# Patient Record
Sex: Male | Born: 1940 | Race: White | Hispanic: No | Marital: Single
Health system: Southern US, Community
[De-identification: ages and names within clinical notes are randomized; demographics above are authoritative.]

---

## 2003-08-17 ENCOUNTER — Other Ambulatory Visit: Payer: Self-pay

## 2004-02-24 ENCOUNTER — Other Ambulatory Visit: Payer: Self-pay

## 2004-02-25 ENCOUNTER — Observation Stay: Payer: Self-pay | Admitting: Internal Medicine

## 2004-07-14 ENCOUNTER — Ambulatory Visit: Payer: Self-pay | Admitting: Unknown Physician Specialty

## 2005-02-13 ENCOUNTER — Ambulatory Visit: Payer: Self-pay | Admitting: Rheumatology

## 2006-08-23 ENCOUNTER — Other Ambulatory Visit: Payer: Self-pay

## 2006-08-23 ENCOUNTER — Inpatient Hospital Stay: Payer: Self-pay | Admitting: Internal Medicine

## 2007-05-05 ENCOUNTER — Other Ambulatory Visit: Payer: Self-pay

## 2007-05-05 ENCOUNTER — Inpatient Hospital Stay: Payer: Self-pay | Admitting: Internal Medicine

## 2007-11-17 ENCOUNTER — Ambulatory Visit: Payer: Self-pay | Admitting: Internal Medicine

## 2008-03-09 ENCOUNTER — Ambulatory Visit: Payer: Self-pay | Admitting: Unknown Physician Specialty

## 2008-07-12 ENCOUNTER — Ambulatory Visit: Payer: Self-pay | Admitting: Cardiology

## 2008-07-16 ENCOUNTER — Ambulatory Visit: Payer: Self-pay | Admitting: Cardiology

## 2010-06-21 ENCOUNTER — Ambulatory Visit: Payer: Self-pay | Admitting: Internal Medicine

## 2011-06-11 ENCOUNTER — Ambulatory Visit: Payer: Self-pay | Admitting: Internal Medicine

## 2011-07-26 ENCOUNTER — Ambulatory Visit: Payer: Self-pay | Admitting: Unknown Physician Specialty

## 2011-09-06 ENCOUNTER — Ambulatory Visit: Payer: Self-pay | Admitting: Specialist

## 2012-07-31 ENCOUNTER — Ambulatory Visit: Payer: Self-pay

## 2012-11-06 ENCOUNTER — Other Ambulatory Visit: Payer: Self-pay | Admitting: Unknown Physician Specialty

## 2012-11-06 DIAGNOSIS — R1310 Dysphagia, unspecified: Secondary | ICD-10-CM

## 2012-12-05 ENCOUNTER — Ambulatory Visit
Admission: RE | Admit: 2012-12-05 | Discharge: 2012-12-05 | Disposition: A | Discharge status: Home / Self Care | Payer: Medicare Other | Source: Ambulatory Visit | Attending: Unknown Physician Specialty | Admitting: Unknown Physician Specialty

## 2012-12-05 DIAGNOSIS — R1310 Dysphagia, unspecified: Secondary | ICD-10-CM

## 2013-06-11 ENCOUNTER — Inpatient Hospital Stay: Payer: Self-pay | Admitting: Family Medicine

## 2013-06-11 LAB — URINALYSIS, COMPLETE
Bacteria: NONE SEEN
Bilirubin,UR: NEGATIVE
Blood: NEGATIVE
GLUCOSE, UR: NEGATIVE mg/dL (ref 0–75)
Ketone: NEGATIVE
Leukocyte Esterase: NEGATIVE
Nitrite: NEGATIVE
PH: 7 (ref 4.5–8.0)
PROTEIN: NEGATIVE
RBC, UR: NONE SEEN /HPF (ref 0–5)
SPECIFIC GRAVITY: 1.004 (ref 1.003–1.030)
Squamous Epithelial: NONE SEEN
WBC UR: <1 /HPF (ref 0–5)

## 2013-06-11 LAB — CBC WITH DIFFERENTIAL/PLATELET
BASOS ABS: 0.2 10*3/uL — AB (ref 0.0–0.1)
Basophil %: 1.4 %
EOS ABS: 0.1 10*3/uL (ref 0.0–0.7)
EOS PCT: 1.1 %
HCT: 28.9 % — ABNORMAL LOW (ref 40.0–52.0)
HGB: 9.1 g/dL — ABNORMAL LOW (ref 13.0–18.0)
LYMPHS ABS: 2 10*3/uL (ref 1.0–3.6)
Lymphocyte %: 16.1 %
MCH: 25.5 pg — ABNORMAL LOW (ref 26.0–34.0)
MCHC: 31.5 g/dL — ABNORMAL LOW (ref 32.0–36.0)
MCV: 81 fL (ref 80–100)
MONO ABS: 0.9 x10 3/mm (ref 0.2–1.0)
Monocyte %: 7.6 %
NEUTROS PCT: 73.8 %
Neutrophil #: 8.9 10*3/uL — ABNORMAL HIGH (ref 1.4–6.5)
PLATELETS: 279 10*3/uL (ref 150–440)
RBC: 3.57 10*6/uL — AB (ref 4.40–5.90)
RDW: 16.7 % — ABNORMAL HIGH (ref 11.5–14.5)
WBC: 12.1 10*3/uL — ABNORMAL HIGH (ref 3.8–10.6)

## 2013-06-11 LAB — COMPREHENSIVE METABOLIC PANEL
ALK PHOS: 118 U/L — AB
ANION GAP: 6 — AB (ref 7–16)
Albumin: 2.8 g/dL — ABNORMAL LOW (ref 3.4–5.0)
BUN: 19 mg/dL — ABNORMAL HIGH (ref 7–18)
Bilirubin,Total: 0.8 mg/dL (ref 0.2–1.0)
CALCIUM: 8.2 mg/dL — AB (ref 8.5–10.1)
Chloride: 100 mmol/L (ref 98–107)
Co2: 28 mmol/L (ref 21–32)
Creatinine: 1.26 mg/dL (ref 0.60–1.30)
EGFR (African American): >60
GFR CALC NON AF AMER: 56 — AB
Glucose: 114 mg/dL — ABNORMAL HIGH (ref 65–99)
Osmolality: 271 (ref 275–301)
Potassium: 3.5 mmol/L (ref 3.5–5.1)
SGOT(AST): 24 U/L (ref 15–37)
SGPT (ALT): 14 U/L (ref 12–78)
Sodium: 134 mmol/L — ABNORMAL LOW (ref 136–145)
TOTAL PROTEIN: 7.4 g/dL (ref 6.4–8.2)

## 2013-06-11 LAB — CK TOTAL AND CKMB (NOT AT ARMC)
CK, TOTAL: 62 U/L
CK, Total: 55 U/L
CK-MB: 3.3 ng/mL (ref 0.5–3.6)
CK-MB: 2.7 ng/mL (ref 0.5–3.6)

## 2013-06-11 LAB — TROPONIN I: Troponin-I: 0.03 ng/mL

## 2013-06-11 LAB — PROTIME-INR
INR: 1.3
PROTHROMBIN TIME: 15.7 s — AB (ref 11.5–14.7)

## 2013-06-11 LAB — HEMOGLOBIN A1C: Hemoglobin A1C: 5.5 % (ref 4.2–6.3)

## 2013-06-12 LAB — COMPREHENSIVE METABOLIC PANEL
ALBUMIN: 2.6 g/dL — AB (ref 3.4–5.0)
ALT: 12 U/L (ref 12–78)
Alkaline Phosphatase: 106 U/L
Anion Gap: 5 — ABNORMAL LOW (ref 7–16)
BUN: 18 mg/dL (ref 7–18)
Bilirubin,Total: 0.9 mg/dL (ref 0.2–1.0)
CALCIUM: 8.7 mg/dL (ref 8.5–10.1)
CO2: 27 mmol/L (ref 21–32)
CREATININE: 1.16 mg/dL (ref 0.60–1.30)
Chloride: 103 mmol/L (ref 98–107)
GLUCOSE: 102 mg/dL — AB (ref 65–99)
Osmolality: 272 (ref 275–301)
Potassium: 3.8 mmol/L (ref 3.5–5.1)
SGOT(AST): 17 U/L (ref 15–37)
Sodium: 135 mmol/L — ABNORMAL LOW (ref 136–145)
Total Protein: 7 g/dL (ref 6.4–8.2)

## 2013-06-12 LAB — CK TOTAL AND CKMB (NOT AT ARMC)
CK, Total: 62 U/L
CK-MB: 2.3 ng/mL (ref 0.5–3.6)

## 2013-06-12 LAB — CBC WITH DIFFERENTIAL/PLATELET
Basophil #: 0.1 10*3/uL (ref 0.0–0.1)
Basophil %: 0.8 %
EOS ABS: 0.1 10*3/uL (ref 0.0–0.7)
Eosinophil %: 1.2 %
HCT: 27.9 % — ABNORMAL LOW (ref 40.0–52.0)
HGB: 9.1 g/dL — AB (ref 13.0–18.0)
Lymphocyte #: 1.6 10*3/uL (ref 1.0–3.6)
Lymphocyte %: 14.3 %
MCH: 26.4 pg (ref 26.0–34.0)
MCHC: 32.5 g/dL (ref 32.0–36.0)
MCV: 81 fL (ref 80–100)
Monocyte #: 1 x10 3/mm (ref 0.2–1.0)
Monocyte %: 9.3 %
NEUTROS ABS: 8.4 10*3/uL — AB (ref 1.4–6.5)
Neutrophil %: 74.4 %
PLATELETS: 281 10*3/uL (ref 150–440)
RBC: 3.44 10*6/uL — AB (ref 4.40–5.90)
RDW: 16.7 % — ABNORMAL HIGH (ref 11.5–14.5)
WBC: 11.2 10*3/uL — AB (ref 3.8–10.6)

## 2013-06-12 LAB — TROPONIN I: TROPONIN-I: 0.02 ng/mL

## 2013-06-13 LAB — BASIC METABOLIC PANEL
Anion Gap: 6 — ABNORMAL LOW (ref 7–16)
BUN: 16 mg/dL (ref 7–18)
CREATININE: 1.16 mg/dL (ref 0.60–1.30)
Calcium, Total: 9.2 mg/dL (ref 8.5–10.1)
Chloride: 101 mmol/L (ref 98–107)
Co2: 28 mmol/L (ref 21–32)
EGFR (African American): >60
EGFR (Non-African Amer.): >60
GLUCOSE: 104 mg/dL — AB (ref 65–99)
OSMOLALITY: 272 (ref 275–301)
Potassium: 4 mmol/L (ref 3.5–5.1)
Sodium: 135 mmol/L — ABNORMAL LOW (ref 136–145)

## 2013-06-14 LAB — BASIC METABOLIC PANEL
Anion Gap: 5 — ABNORMAL LOW (ref 7–16)
BUN: 17 mg/dL (ref 7–18)
CHLORIDE: 100 mmol/L (ref 98–107)
Calcium, Total: 9 mg/dL (ref 8.5–10.1)
Co2: 28 mmol/L (ref 21–32)
Creatinine: 1.15 mg/dL (ref 0.60–1.30)
EGFR (African American): >60
Glucose: 109 mg/dL — ABNORMAL HIGH (ref 65–99)
OSMOLALITY: 269 (ref 275–301)
POTASSIUM: 3.8 mmol/L (ref 3.5–5.1)
Sodium: 133 mmol/L — ABNORMAL LOW (ref 136–145)

## 2013-09-17 ENCOUNTER — Ambulatory Visit: Payer: Self-pay | Admitting: Internal Medicine

## 2013-09-17 LAB — CBC CANCER CENTER
Bands: 2 %
Eosinophil: 1 %
HCT: 25 % — ABNORMAL LOW (ref 40.0–52.0)
HGB: 8 g/dL — AB (ref 13.0–18.0)
LYMPHS PCT: 11 %
MCH: 25.8 pg — ABNORMAL LOW (ref 26.0–34.0)
MCHC: 32.1 g/dL (ref 32.0–36.0)
MCV: 80 fL (ref 80–100)
Monocytes: 6 %
PLATELETS: 322 x10 3/mm (ref 150–440)
RBC: 3.12 10*6/uL — AB (ref 4.40–5.90)
RDW: 19.1 % — ABNORMAL HIGH (ref 11.5–14.5)
SEGMENTED NEUTROPHILS: 80 %
WBC: 11.2 x10 3/mm — ABNORMAL HIGH (ref 3.8–10.6)

## 2013-09-17 LAB — IRON AND TIBC
IRON SATURATION: 13 %
Iron Bind.Cap.(Total): 304 ug/dL (ref 250–450)
Iron: 41 ug/dL — ABNORMAL LOW (ref 65–175)
UNBOUND IRON-BIND. CAP.: 263 ug/dL

## 2013-09-17 LAB — LACTATE DEHYDROGENASE: LDH: 235 U/L (ref 85–241)

## 2013-09-17 LAB — FOLATE: FOLIC ACID: 18.1 ng/mL (ref 3.1–100.0)

## 2013-09-21 LAB — KAPPA/LAMBDA FREE LIGHT CHAINS (ARMC)

## 2013-09-22 LAB — CBC WITH DIFFERENTIAL/PLATELET
Basophil #: 0.1 10*3/uL (ref 0.0–0.1)
Basophil %: 0.7 %
EOS ABS: 0.1 10*3/uL (ref 0.0–0.7)
EOS PCT: 1.5 %
HCT: 26.6 % — AB (ref 40.0–52.0)
HGB: 8.8 g/dL — ABNORMAL LOW (ref 13.0–18.0)
LYMPHS PCT: 14.4 %
Lymphocyte #: 1.4 10*3/uL (ref 1.0–3.6)
MCH: 26.3 pg (ref 26.0–34.0)
MCHC: 33.1 g/dL (ref 32.0–36.0)
MCV: 80 fL (ref 80–100)
MONOS PCT: 10.8 %
Monocyte #: 1.1 x10 3/mm — ABNORMAL HIGH (ref 0.2–1.0)
NEUTROS PCT: 72.6 %
Neutrophil #: 7.2 10*3/uL — ABNORMAL HIGH (ref 1.4–6.5)
PLATELETS: 327 10*3/uL (ref 150–440)
RBC: 3.34 10*6/uL — AB (ref 4.40–5.90)
RDW: 18.9 % — ABNORMAL HIGH (ref 11.5–14.5)
WBC: 9.9 10*3/uL (ref 3.8–10.6)

## 2013-09-22 LAB — COMPREHENSIVE METABOLIC PANEL
Albumin: 2.7 g/dL — ABNORMAL LOW (ref 3.4–5.0)
Alkaline Phosphatase: 147 U/L — ABNORMAL HIGH
Anion Gap: 10 (ref 7–16)
BUN: 56 mg/dL — AB (ref 7–18)
Bilirubin,Total: 0.7 mg/dL (ref 0.2–1.0)
CALCIUM: 8.9 mg/dL (ref 8.5–10.1)
CO2: 29 mmol/L (ref 21–32)
Chloride: 79 mmol/L — ABNORMAL LOW (ref 98–107)
Creatinine: 1.91 mg/dL — ABNORMAL HIGH (ref 0.60–1.30)
EGFR (Non-African Amer.): 34 — ABNORMAL LOW
GFR CALC AF AMER: 39 — AB
GLUCOSE: 122 mg/dL — AB (ref 65–99)
Osmolality: 255 (ref 275–301)
Potassium: 3.3 mmol/L — ABNORMAL LOW (ref 3.5–5.1)
SGOT(AST): 28 U/L (ref 15–37)
SGPT (ALT): 19 U/L (ref 12–78)
Sodium: 118 mmol/L — CL (ref 136–145)
Total Protein: 7.7 g/dL (ref 6.4–8.2)

## 2013-09-22 LAB — CBC CANCER CENTER
BASOS ABS: 1 %
Bands: 1 %
EOS PCT: 3 %
HCT: 26.1 % — ABNORMAL LOW (ref 40.0–52.0)
HGB: 8.6 g/dL — ABNORMAL LOW (ref 13.0–18.0)
LYMPHS PCT: 8 %
MCH: 26.2 pg (ref 26.0–34.0)
MCHC: 32.8 g/dL (ref 32.0–36.0)
MCV: 80 fL (ref 80–100)
MONOS PCT: 6 %
Platelet: 341 x10 3/mm (ref 150–440)
RBC: 3.28 10*6/uL — AB (ref 4.40–5.90)
RDW: 19 % — ABNORMAL HIGH (ref 11.5–14.5)
Segmented Neutrophils: 78 %
Variant Lymphocyte: 3 %
WBC: 11 x10 3/mm — ABNORMAL HIGH (ref 3.8–10.6)

## 2013-09-22 LAB — CREATININE, SERUM
Creatinine: 1.81 mg/dL — ABNORMAL HIGH (ref 0.60–1.30)
EGFR (Non-African Amer.): 36 — ABNORMAL LOW
GFR CALC AF AMER: 42 — AB

## 2013-09-22 LAB — RETICULOCYTES
ABSOLUTE RETIC COUNT: 0.1103 10*6/uL (ref 0.019–0.186)
Reticulocyte: 3.37 % — ABNORMAL HIGH (ref 0.4–3.1)

## 2013-09-22 LAB — CALCIUM: CALCIUM: 9.3 mg/dL (ref 8.5–10.1)

## 2013-09-23 ENCOUNTER — Inpatient Hospital Stay: Payer: Self-pay | Admitting: Internal Medicine

## 2013-09-23 LAB — CBC WITH DIFFERENTIAL/PLATELET
BASOS ABS: 0.1 10*3/uL (ref 0.0–0.1)
Basophil %: 0.9 %
EOS PCT: 1.6 %
Eosinophil #: 0.2 10*3/uL (ref 0.0–0.7)
HCT: 24.2 % — ABNORMAL LOW (ref 40.0–52.0)
HGB: 7.9 g/dL — ABNORMAL LOW (ref 13.0–18.0)
Lymphocyte #: 1.6 10*3/uL (ref 1.0–3.6)
Lymphocyte %: 14.8 %
MCH: 26.2 pg (ref 26.0–34.0)
MCHC: 32.9 g/dL (ref 32.0–36.0)
MCV: 80 fL (ref 80–100)
MONO ABS: 1.2 x10 3/mm — AB (ref 0.2–1.0)
MONOS PCT: 11.6 %
NEUTROS ABS: 7.5 10*3/uL — AB (ref 1.4–6.5)
Neutrophil %: 71.1 %
PLATELETS: 300 10*3/uL (ref 150–440)
RBC: 3.04 10*6/uL — ABNORMAL LOW (ref 4.40–5.90)
RDW: 19.3 % — AB (ref 11.5–14.5)
WBC: 10.5 10*3/uL (ref 3.8–10.6)

## 2013-09-23 LAB — BASIC METABOLIC PANEL
Anion Gap: 11 (ref 7–16)
BUN: 60 mg/dL — AB (ref 7–18)
Calcium, Total: 8.5 mg/dL (ref 8.5–10.1)
Chloride: 83 mmol/L — ABNORMAL LOW (ref 98–107)
Co2: 27 mmol/L (ref 21–32)
Creatinine: 1.86 mg/dL — ABNORMAL HIGH (ref 0.60–1.30)
EGFR (African American): 41 — ABNORMAL LOW
GFR CALC NON AF AMER: 35 — AB
Glucose: 97 mg/dL (ref 65–99)
OSMOLALITY: 261 (ref 275–301)
POTASSIUM: 3.5 mmol/L (ref 3.5–5.1)
Sodium: 121 mmol/L — ABNORMAL LOW (ref 136–145)

## 2013-09-23 LAB — URINALYSIS, COMPLETE
BLOOD: NEGATIVE
Bacteria: NONE SEEN
Bilirubin,UR: NEGATIVE
Glucose,UR: NEGATIVE mg/dL (ref 0–75)
Ketone: NEGATIVE
Leukocyte Esterase: NEGATIVE
Nitrite: NEGATIVE
PROTEIN: NEGATIVE
Ph: 7 (ref 4.5–8.0)
RBC,UR: NONE SEEN /HPF (ref 0–5)
Specific Gravity: 1.008 (ref 1.003–1.030)
Squamous Epithelial: NONE SEEN
WBC UR: NONE SEEN /HPF (ref 0–5)

## 2013-09-23 LAB — SODIUM
Sodium: 122 mmol/L — ABNORMAL LOW (ref 136–145)
Sodium: 123 mmol/L — ABNORMAL LOW (ref 136–145)

## 2013-09-23 LAB — OCCULT BLOOD X 1 CARD TO LAB, STOOL: Occult Blood, Feces: NEGATIVE

## 2013-09-23 LAB — TSH: Thyroid Stimulating Horm: 3.01 u[IU]/mL

## 2013-09-24 LAB — BASIC METABOLIC PANEL
Anion Gap: 8 (ref 7–16)
BUN: 56 mg/dL — ABNORMAL HIGH (ref 7–18)
CHLORIDE: 88 mmol/L — AB (ref 98–107)
CREATININE: 1.76 mg/dL — AB (ref 0.60–1.30)
Calcium, Total: 8.7 mg/dL (ref 8.5–10.1)
Co2: 28 mmol/L (ref 21–32)
GFR CALC AF AMER: 43 — AB
GFR CALC NON AF AMER: 38 — AB
GLUCOSE: 116 mg/dL — AB (ref 65–99)
Osmolality: 266 (ref 275–301)
POTASSIUM: 3.7 mmol/L (ref 3.5–5.1)
Sodium: 124 mmol/L — ABNORMAL LOW (ref 136–145)

## 2013-09-24 LAB — CBC WITH DIFFERENTIAL/PLATELET
Basophil #: 0.1 10*3/uL (ref 0.0–0.1)
Basophil %: 0.8 %
Eosinophil #: 0.1 10*3/uL (ref 0.0–0.7)
Eosinophil %: 1.4 %
HCT: 23.8 % — AB (ref 40.0–52.0)
HGB: 7.9 g/dL — ABNORMAL LOW (ref 13.0–18.0)
LYMPHS PCT: 12.9 %
Lymphocyte #: 1.1 10*3/uL (ref 1.0–3.6)
MCH: 27.4 pg (ref 26.0–34.0)
MCHC: 33.2 g/dL (ref 32.0–36.0)
MCV: 83 fL (ref 80–100)
Monocyte #: 1.1 x10 3/mm — ABNORMAL HIGH (ref 0.2–1.0)
Monocyte %: 12.4 %
NEUTROS PCT: 72.5 %
Neutrophil #: 6.4 10*3/uL (ref 1.4–6.5)
PLATELETS: 241 10*3/uL (ref 150–440)
RBC: 2.89 10*6/uL — ABNORMAL LOW (ref 4.40–5.90)
RDW: 19.8 % — ABNORMAL HIGH (ref 11.5–14.5)
WBC: 8.9 10*3/uL (ref 3.8–10.6)

## 2013-09-24 LAB — SODIUM: SODIUM: 125 mmol/L — AB (ref 136–145)

## 2013-09-24 LAB — PROTEIN / CREATININE RATIO, URINE
CREATININE, URINE: 44.5 mg/dL (ref 30.0–125.0)
PROTEIN, RANDOM URINE: 8 mg/dL (ref 0–12)
Protein/Creat. Ratio: 180 mg/gCREAT (ref 0–200)

## 2013-09-24 LAB — PROTIME-INR
INR: 1.3
PROTHROMBIN TIME: 15.8 s — AB (ref 11.5–14.7)

## 2013-09-24 LAB — PROTEIN ELECTROPHORESIS(ARMC)

## 2013-09-25 LAB — CBC WITH DIFFERENTIAL/PLATELET
Basophil #: 0.1 10*3/uL (ref 0.0–0.1)
Basophil %: 1 %
EOS PCT: 1.9 %
Eosinophil #: 0.2 10*3/uL (ref 0.0–0.7)
HCT: 23.5 % — ABNORMAL LOW (ref 40.0–52.0)
HGB: 7.8 g/dL — AB (ref 13.0–18.0)
LYMPHS ABS: 1.3 10*3/uL (ref 1.0–3.6)
Lymphocyte %: 14.3 %
MCH: 27.3 pg (ref 26.0–34.0)
MCHC: 33.3 g/dL (ref 32.0–36.0)
MCV: 82 fL (ref 80–100)
MONOS PCT: 13.3 %
Monocyte #: 1.2 x10 3/mm — ABNORMAL HIGH (ref 0.2–1.0)
Neutrophil #: 6.2 10*3/uL (ref 1.4–6.5)
Neutrophil %: 69.5 %
PLATELETS: 252 10*3/uL (ref 150–440)
RBC: 2.86 10*6/uL — AB (ref 4.40–5.90)
RDW: 20 % — ABNORMAL HIGH (ref 11.5–14.5)
WBC: 9 10*3/uL (ref 3.8–10.6)

## 2013-09-25 LAB — BASIC METABOLIC PANEL
Anion Gap: 7 (ref 7–16)
BUN: 50 mg/dL — ABNORMAL HIGH (ref 7–18)
CALCIUM: 8.4 mg/dL — AB (ref 8.5–10.1)
CO2: 26 mmol/L (ref 21–32)
Chloride: 93 mmol/L — ABNORMAL LOW (ref 98–107)
Creatinine: 1.46 mg/dL — ABNORMAL HIGH (ref 0.60–1.30)
EGFR (Non-African Amer.): 47 — ABNORMAL LOW
GFR CALC AF AMER: 55 — AB
GLUCOSE: 97 mg/dL (ref 65–99)
OSMOLALITY: 267 (ref 275–301)
Potassium: 4.4 mmol/L (ref 3.5–5.1)
Sodium: 126 mmol/L — ABNORMAL LOW (ref 136–145)

## 2013-09-25 LAB — AFP TUMOR MARKER: AFP TUMOR MARKER: 0.9 ng/mL (ref 0.0–8.3)

## 2013-09-26 LAB — BASIC METABOLIC PANEL
Anion Gap: 5 — ABNORMAL LOW (ref 7–16)
BUN: 47 mg/dL — AB (ref 7–18)
CALCIUM: 8.4 mg/dL — AB (ref 8.5–10.1)
CO2: 27 mmol/L (ref 21–32)
CREATININE: 1.4 mg/dL — AB (ref 0.60–1.30)
Chloride: 96 mmol/L — ABNORMAL LOW (ref 98–107)
EGFR (Non-African Amer.): 49 — ABNORMAL LOW
GFR CALC AF AMER: 57 — AB
Glucose: 108 mg/dL — ABNORMAL HIGH (ref 65–99)
Osmolality: 270 (ref 275–301)
Potassium: 4.3 mmol/L (ref 3.5–5.1)
Sodium: 128 mmol/L — ABNORMAL LOW (ref 136–145)

## 2013-09-26 LAB — AMMONIA: Ammonia, Plasma: 39 mcmol/L — ABNORMAL HIGH (ref 11–32)

## 2013-09-26 LAB — HEMOGLOBIN: HGB: 7.4 g/dL — AB (ref 13.0–18.0)

## 2013-09-27 LAB — BASIC METABOLIC PANEL
Anion Gap: 9 (ref 7–16)
BUN: 42 mg/dL — AB (ref 7–18)
CHLORIDE: 98 mmol/L (ref 98–107)
Calcium, Total: 8.1 mg/dL — ABNORMAL LOW (ref 8.5–10.1)
Co2: 23 mmol/L (ref 21–32)
Creatinine: 1.58 mg/dL — ABNORMAL HIGH (ref 0.60–1.30)
EGFR (African American): 50 — ABNORMAL LOW
EGFR (Non-African Amer.): 43 — ABNORMAL LOW
GLUCOSE: 91 mg/dL (ref 65–99)
Osmolality: 271 (ref 275–301)
Potassium: 4.8 mmol/L (ref 3.5–5.1)
SODIUM: 130 mmol/L — AB (ref 136–145)

## 2013-09-28 LAB — BASIC METABOLIC PANEL
Anion Gap: 9 (ref 7–16)
BUN: 41 mg/dL — ABNORMAL HIGH (ref 7–18)
CHLORIDE: 98 mmol/L (ref 98–107)
CREATININE: 1.28 mg/dL (ref 0.60–1.30)
Calcium, Total: 8.6 mg/dL (ref 8.5–10.1)
Co2: 25 mmol/L (ref 21–32)
EGFR (African American): >60
GFR CALC NON AF AMER: 55 — AB
Glucose: 86 mg/dL (ref 65–99)
OSMOLALITY: 274 (ref 275–301)
POTASSIUM: 4.4 mmol/L (ref 3.5–5.1)
Sodium: 132 mmol/L — ABNORMAL LOW (ref 136–145)

## 2013-09-29 LAB — BASIC METABOLIC PANEL
Anion Gap: 7 (ref 7–16)
BUN: 41 mg/dL — AB (ref 7–18)
CALCIUM: 8.7 mg/dL (ref 8.5–10.1)
CO2: 25 mmol/L (ref 21–32)
Chloride: 99 mmol/L (ref 98–107)
Creatinine: 1.6 mg/dL — ABNORMAL HIGH (ref 0.60–1.30)
EGFR (Non-African Amer.): 42 — ABNORMAL LOW
GFR CALC AF AMER: 49 — AB
Glucose: 94 mg/dL (ref 65–99)
Osmolality: 273 (ref 275–301)
POTASSIUM: 4.6 mmol/L (ref 3.5–5.1)
Sodium: 131 mmol/L — ABNORMAL LOW (ref 136–145)

## 2013-09-29 LAB — CBC WITH DIFFERENTIAL/PLATELET
Basophil #: 0.1 10*3/uL (ref 0.0–0.1)
Basophil %: 0.7 %
EOS PCT: 1.9 %
Eosinophil #: 0.2 10*3/uL (ref 0.0–0.7)
HCT: 22.3 % — AB (ref 40.0–52.0)
HGB: 7 g/dL — AB (ref 13.0–18.0)
LYMPHS PCT: 11.7 %
Lymphocyte #: 1.4 10*3/uL (ref 1.0–3.6)
MCH: 26.2 pg (ref 26.0–34.0)
MCHC: 31.4 g/dL — ABNORMAL LOW (ref 32.0–36.0)
MCV: 84 fL (ref 80–100)
MONO ABS: 1.1 x10 3/mm — AB (ref 0.2–1.0)
Monocyte %: 9.9 %
Neutrophil #: 8.7 10*3/uL — ABNORMAL HIGH (ref 1.4–6.5)
Neutrophil %: 75.8 %
Platelet: 224 10*3/uL (ref 150–440)
RBC: 2.67 10*6/uL — ABNORMAL LOW (ref 4.40–5.90)
RDW: 20.3 % — ABNORMAL HIGH (ref 11.5–14.5)
WBC: 11.5 10*3/uL — AB (ref 3.8–10.6)

## 2013-09-29 LAB — UR PROT ELECTROPHORESIS, URINE RANDOM

## 2013-09-30 LAB — CBC WITH DIFFERENTIAL/PLATELET
BASOS ABS: 0.1 10*3/uL (ref 0.0–0.1)
Basophil %: 0.6 %
Eosinophil #: 0.3 10*3/uL (ref 0.0–0.7)
Eosinophil %: 2.6 %
HCT: 23.8 % — AB (ref 40.0–52.0)
HGB: 7.5 g/dL — ABNORMAL LOW (ref 13.0–18.0)
Lymphocyte #: 1 10*3/uL (ref 1.0–3.6)
Lymphocyte %: 9.7 %
MCH: 26.5 pg (ref 26.0–34.0)
MCHC: 31.4 g/dL — AB (ref 32.0–36.0)
MCV: 84 fL (ref 80–100)
MONO ABS: 1.1 x10 3/mm — AB (ref 0.2–1.0)
MONOS PCT: 10.6 %
NEUTROS PCT: 76.5 %
Neutrophil #: 8.2 10*3/uL — ABNORMAL HIGH (ref 1.4–6.5)
PLATELETS: 227 10*3/uL (ref 150–440)
RBC: 2.82 10*6/uL — ABNORMAL LOW (ref 4.40–5.90)
RDW: 21 % — ABNORMAL HIGH (ref 11.5–14.5)
WBC: 10.7 10*3/uL — ABNORMAL HIGH (ref 3.8–10.6)

## 2013-09-30 LAB — BASIC METABOLIC PANEL
ANION GAP: 8 (ref 7–16)
BUN: 43 mg/dL — ABNORMAL HIGH (ref 7–18)
Calcium, Total: 8.3 mg/dL — ABNORMAL LOW (ref 8.5–10.1)
Chloride: 96 mmol/L — ABNORMAL LOW (ref 98–107)
Co2: 23 mmol/L (ref 21–32)
Creatinine: 2.28 mg/dL — ABNORMAL HIGH (ref 0.60–1.30)
GFR CALC AF AMER: 32 — AB
GFR CALC NON AF AMER: 27 — AB
GLUCOSE: 92 mg/dL (ref 65–99)
Osmolality: 266 (ref 275–301)
POTASSIUM: 4.7 mmol/L (ref 3.5–5.1)
Sodium: 127 mmol/L — ABNORMAL LOW (ref 136–145)

## 2013-10-01 LAB — CBC WITH DIFFERENTIAL/PLATELET
Basophil #: 0.1 10*3/uL (ref 0.0–0.1)
Basophil %: 0.9 %
EOS ABS: 0.2 10*3/uL (ref 0.0–0.7)
Eosinophil %: 3 %
HCT: 23.2 % — ABNORMAL LOW (ref 40.0–52.0)
HGB: 7.4 g/dL — ABNORMAL LOW (ref 13.0–18.0)
Lymphocyte #: 1.5 10*3/uL (ref 1.0–3.6)
Lymphocyte %: 18 %
MCH: 27 pg (ref 26.0–34.0)
MCHC: 32.1 g/dL (ref 32.0–36.0)
MCV: 84 fL (ref 80–100)
MONO ABS: 1 x10 3/mm (ref 0.2–1.0)
Monocyte %: 12.1 %
NEUTROS PCT: 66 %
Neutrophil #: 5.4 10*3/uL (ref 1.4–6.5)
Platelet: 221 10*3/uL (ref 150–440)
RBC: 2.76 10*6/uL — AB (ref 4.40–5.90)
RDW: 20.9 % — AB (ref 11.5–14.5)
WBC: 8.2 10*3/uL (ref 3.8–10.6)

## 2013-10-01 LAB — BASIC METABOLIC PANEL
Anion Gap: 6 — ABNORMAL LOW (ref 7–16)
BUN: 48 mg/dL — AB (ref 7–18)
Calcium, Total: 8.5 mg/dL (ref 8.5–10.1)
Chloride: 98 mmol/L (ref 98–107)
Co2: 26 mmol/L (ref 21–32)
Creatinine: 2.38 mg/dL — ABNORMAL HIGH (ref 0.60–1.30)
EGFR (African American): 30 — ABNORMAL LOW
EGFR (Non-African Amer.): 26 — ABNORMAL LOW
Glucose: 95 mg/dL (ref 65–99)
OSMOLALITY: 273 (ref 275–301)
Potassium: 5.3 mmol/L — ABNORMAL HIGH (ref 3.5–5.1)
SODIUM: 130 mmol/L — AB (ref 136–145)

## 2013-10-17 ENCOUNTER — Ambulatory Visit: Payer: Self-pay | Admitting: Internal Medicine

## 2013-10-25 ENCOUNTER — Emergency Department: Payer: Self-pay | Admitting: Emergency Medicine

## 2013-10-25 LAB — CBC
HCT: 30.7 % — ABNORMAL LOW (ref 40.0–52.0)
HGB: 9.5 g/dL — ABNORMAL LOW (ref 13.0–18.0)
MCH: 25.8 pg — ABNORMAL LOW (ref 26.0–34.0)
MCHC: 30.8 g/dL — ABNORMAL LOW (ref 32.0–36.0)
MCV: 84 fL (ref 80–100)
Platelet: 280 10*3/uL (ref 150–440)
RBC: 3.67 10*6/uL — AB (ref 4.40–5.90)
RDW: 20 % — AB (ref 11.5–14.5)
WBC: 9 10*3/uL (ref 3.8–10.6)

## 2013-10-25 LAB — URINALYSIS, COMPLETE
BLOOD: NEGATIVE
Bacteria: NONE SEEN
Bilirubin,UR: NEGATIVE
Glucose,UR: NEGATIVE mg/dL (ref 0–75)
Ketone: NEGATIVE
Leukocyte Esterase: NEGATIVE
Nitrite: NEGATIVE
PH: 6 (ref 4.5–8.0)
Protein: NEGATIVE
RBC,UR: NONE SEEN /HPF (ref 0–5)
Specific Gravity: 1.009 (ref 1.003–1.030)
Squamous Epithelial: NONE SEEN
WBC UR: <1 /HPF (ref 0–5)

## 2013-10-25 LAB — BASIC METABOLIC PANEL
Anion Gap: 10 (ref 7–16)
BUN: 23 mg/dL — AB (ref 7–18)
CHLORIDE: 99 mmol/L (ref 98–107)
CO2: 27 mmol/L (ref 21–32)
Calcium, Total: 8.5 mg/dL (ref 8.5–10.1)
Creatinine: 1.59 mg/dL — ABNORMAL HIGH (ref 0.60–1.30)
EGFR (African American): 49 — ABNORMAL LOW
EGFR (Non-African Amer.): 42 — ABNORMAL LOW
Glucose: 92 mg/dL (ref 65–99)
Osmolality: 275 (ref 275–301)
Potassium: 4.2 mmol/L (ref 3.5–5.1)
Sodium: 136 mmol/L (ref 136–145)

## 2013-10-25 LAB — CK: CK, Total: 67 U/L

## 2013-10-25 LAB — TROPONIN I: Troponin-I: 0.03 ng/mL

## 2013-11-17 ENCOUNTER — Ambulatory Visit: Payer: Self-pay | Admitting: Hospice and Palliative Medicine

## 2013-11-21 ENCOUNTER — Inpatient Hospital Stay: Payer: Self-pay

## 2013-11-21 LAB — CBC WITH DIFFERENTIAL/PLATELET
BASOS ABS: 0.1 10*3/uL (ref 0.0–0.1)
BASOS PCT: 1.3 %
Eosinophil #: 0.1 10*3/uL (ref 0.0–0.7)
Eosinophil %: 1.8 %
HCT: 28.4 % — ABNORMAL LOW (ref 40.0–52.0)
HGB: 9.1 g/dL — ABNORMAL LOW (ref 13.0–18.0)
Lymphocyte #: 1.4 10*3/uL (ref 1.0–3.6)
Lymphocyte %: 18.8 %
MCH: 26.3 pg (ref 26.0–34.0)
MCHC: 32.2 g/dL (ref 32.0–36.0)
MCV: 82 fL (ref 80–100)
Monocyte #: 1 x10 3/mm (ref 0.2–1.0)
Monocyte %: 13.6 %
NEUTROS PCT: 64.5 %
Neutrophil #: 4.7 10*3/uL (ref 1.4–6.5)
PLATELETS: 261 10*3/uL (ref 150–440)
RBC: 3.47 10*6/uL — ABNORMAL LOW (ref 4.40–5.90)
RDW: 21 % — AB (ref 11.5–14.5)
WBC: 7.3 10*3/uL (ref 3.8–10.6)

## 2013-11-21 LAB — COMPREHENSIVE METABOLIC PANEL
ALBUMIN: 2.4 g/dL — AB (ref 3.4–5.0)
ALK PHOS: 103 U/L
ALT: 15 U/L
ANION GAP: 7 (ref 7–16)
AST: 34 U/L (ref 15–37)
BUN: 36 mg/dL — AB (ref 7–18)
Bilirubin,Total: 1 mg/dL (ref 0.2–1.0)
CREATININE: 2.39 mg/dL — AB (ref 0.60–1.30)
Calcium, Total: 7.9 mg/dL — ABNORMAL LOW (ref 8.5–10.1)
Chloride: 98 mmol/L (ref 98–107)
Co2: 32 mmol/L (ref 21–32)
GFR CALC AF AMER: 30 — AB
GFR CALC NON AF AMER: 26 — AB
GLUCOSE: 98 mg/dL (ref 65–99)
OSMOLALITY: 282 (ref 275–301)
Potassium: 3.4 mmol/L — ABNORMAL LOW (ref 3.5–5.1)
Sodium: 137 mmol/L (ref 136–145)
TOTAL PROTEIN: 6.6 g/dL (ref 6.4–8.2)

## 2013-11-21 LAB — URINALYSIS, COMPLETE
BILIRUBIN, UR: NEGATIVE
Bacteria: NONE SEEN
Blood: NEGATIVE
GLUCOSE, UR: NEGATIVE mg/dL (ref 0–75)
Hyaline Cast: 6
Ketone: NEGATIVE
Leukocyte Esterase: NEGATIVE
NITRITE: NEGATIVE
PROTEIN: NEGATIVE
Ph: 6 (ref 4.5–8.0)
RBC, UR: NONE SEEN /HPF (ref 0–5)
Specific Gravity: 1.008 (ref 1.003–1.030)
Squamous Epithelial: NONE SEEN
WBC UR: 1 /HPF (ref 0–5)

## 2013-11-21 LAB — CK-MB
CK-MB: 4.6 ng/mL — AB (ref 0.5–3.6)
CK-MB: 4.1 ng/mL — ABNORMAL HIGH (ref 0.5–3.6)

## 2013-11-21 LAB — TROPONIN I
Troponin-I: 2.1 ng/mL — ABNORMAL HIGH
Troponin-I: 1.9 ng/mL — ABNORMAL HIGH

## 2013-11-21 LAB — AMMONIA: Ammonia, Plasma: 22 mcmol/L (ref 11–32)

## 2013-11-21 LAB — APTT: ACTIVATED PTT: 31.2 s (ref 23.6–35.9)

## 2013-11-21 LAB — PROTIME-INR
INR: 1.5
Prothrombin Time: 17.9 secs — ABNORMAL HIGH (ref 11.5–14.7)

## 2013-11-22 LAB — BASIC METABOLIC PANEL
Anion Gap: 9 (ref 7–16)
BUN: 37 mg/dL — ABNORMAL HIGH (ref 7–18)
CALCIUM: 7.9 mg/dL — AB (ref 8.5–10.1)
CHLORIDE: 98 mmol/L (ref 98–107)
CO2: 30 mmol/L (ref 21–32)
Creatinine: 2.41 mg/dL — ABNORMAL HIGH (ref 0.60–1.30)
EGFR (African American): 30 — ABNORMAL LOW
EGFR (Non-African Amer.): 26 — ABNORMAL LOW
Glucose: 78 mg/dL (ref 65–99)
Osmolality: 281 (ref 275–301)
Potassium: 3.3 mmol/L — ABNORMAL LOW (ref 3.5–5.1)
SODIUM: 137 mmol/L (ref 136–145)

## 2013-11-22 LAB — TROPONIN I: Troponin-I: 1.6 ng/mL — ABNORMAL HIGH

## 2013-11-22 LAB — CBC WITH DIFFERENTIAL/PLATELET
Basophil #: 0.1 10*3/uL (ref 0.0–0.1)
Basophil %: 1.8 %
Eosinophil #: 0.2 10*3/uL (ref 0.0–0.7)
Eosinophil %: 3.3 %
HCT: 29.3 % — ABNORMAL LOW (ref 40.0–52.0)
HGB: 9.5 g/dL — ABNORMAL LOW (ref 13.0–18.0)
Lymphocyte #: 1.7 10*3/uL (ref 1.0–3.6)
Lymphocyte %: 22.9 %
MCH: 26.3 pg (ref 26.0–34.0)
MCHC: 32.2 g/dL (ref 32.0–36.0)
MCV: 82 fL (ref 80–100)
Monocyte #: 1.1 x10 3/mm — ABNORMAL HIGH (ref 0.2–1.0)
Monocyte %: 14.7 %
Neutrophil #: 4.3 10*3/uL (ref 1.4–6.5)
Neutrophil %: 57.3 %
Platelet: 266 10*3/uL (ref 150–440)
RBC: 3.6 10*6/uL — ABNORMAL LOW (ref 4.40–5.90)
RDW: 20.8 % — AB (ref 11.5–14.5)
WBC: 7.4 10*3/uL (ref 3.8–10.6)

## 2013-11-22 LAB — TSH: Thyroid Stimulating Horm: 7.47 u[IU]/mL — ABNORMAL HIGH

## 2013-11-22 LAB — CK-MB: CK-MB: 3.6 ng/mL (ref 0.5–3.6)

## 2013-11-22 LAB — AMMONIA: Ammonia, Plasma: 44 mcmol/L — ABNORMAL HIGH (ref 11–32)

## 2013-11-23 LAB — AMMONIA: Ammonia, Plasma: 21 mcmol/L (ref 11–32)

## 2013-11-24 LAB — BASIC METABOLIC PANEL
ANION GAP: 5 — AB (ref 7–16)
BUN: 29 mg/dL — ABNORMAL HIGH (ref 7–18)
Calcium, Total: 8.4 mg/dL — ABNORMAL LOW (ref 8.5–10.1)
Chloride: 97 mmol/L — ABNORMAL LOW (ref 98–107)
Co2: 34 mmol/L — ABNORMAL HIGH (ref 21–32)
Creatinine: 1.35 mg/dL — ABNORMAL HIGH (ref 0.60–1.30)
GFR CALC AF AMER: 60 — AB
GFR CALC NON AF AMER: 52 — AB
GLUCOSE: 103 mg/dL — AB (ref 65–99)
OSMOLALITY: 278 (ref 275–301)
Potassium: 3.2 mmol/L — ABNORMAL LOW (ref 3.5–5.1)
Sodium: 136 mmol/L (ref 136–145)

## 2013-11-25 LAB — BASIC METABOLIC PANEL
ANION GAP: 5 — AB (ref 7–16)
BUN: 24 mg/dL — ABNORMAL HIGH (ref 7–18)
Calcium, Total: 8.8 mg/dL (ref 8.5–10.1)
Chloride: 98 mmol/L (ref 98–107)
Co2: 33 mmol/L — ABNORMAL HIGH (ref 21–32)
Creatinine: 1.31 mg/dL — ABNORMAL HIGH (ref 0.60–1.30)
EGFR (African American): >60
EGFR (Non-African Amer.): 54 — ABNORMAL LOW
Glucose: 89 mg/dL (ref 65–99)
Osmolality: 275 (ref 275–301)
Potassium: 3.4 mmol/L — ABNORMAL LOW (ref 3.5–5.1)
Sodium: 136 mmol/L (ref 136–145)

## 2013-11-26 ENCOUNTER — Ambulatory Visit: Payer: Self-pay | Admitting: Internal Medicine

## 2013-12-17 ENCOUNTER — Ambulatory Visit: Payer: Self-pay | Admitting: Hospice and Palliative Medicine

## 2014-01-17 DEATH — deceased

## 2014-07-10 NOTE — Consult Note (Signed)
Chief Complaint:  Subjective/Chief Complaint Continue confusion altered mental status  more alert but still confused. denies any chest pain   VITAL SIGNS/ANCILLARY NOTES: **Vital Signs.:   07-Sep-15 09:47  Vital Signs Type Q 4hr  Temperature Temperature (F) 98.1  Celsius 36.7  Pulse Pulse 64  Respirations Respirations 20  Systolic BP Systolic BP 165  Diastolic BP (mmHg) Diastolic BP (mmHg) 64  Mean BP 89  Pulse Ox % Pulse Ox % 92  Pulse Ox Activity Level  At rest  Oxygen Delivery 3L  Telemetry pattern Cardiac Rhythm pattern reported by Telemetry Clerk; dual paced  *Intake and Output.:   Shift 07-Sep-15 07:00  Grand Totals Intake:  0 Output:  300    Net:  -300 24 Hr.:  -790  IV (Primary)      In:  0  IV (Secondary)      In:  0  Urine ml     Out:  300  Length of Stay Totals Intake:  540 Output:  1525    Net:  -985   Brief Assessment:  GEN well developed, well nourished, no acute distress   Cardiac Irregular  murmur present  + LE edema  -- JVD   Respiratory normal resp effort  clear BS   Gastrointestinal Normal   Gastrointestinal details normal Soft  Nontender  Nondistended  No masses palpable   EXTR positive edema   Lab Results: Thyroid:  06-Sep-15 02:23   Thyroid Stimulating Hormone  7.47 (0.45-4.50 (IU = International Unit)  ----------------------- Pregnant patients have  different reference  ranges for TSH:  - - - - - - - - - -  Pregnant, first trimetser:  0.36 - 2.50 uIU/mL)  Routine Chem:  06-Sep-15 02:23   Ammonia, Plasma  44 (Result(s) reported on 22 Nov 2013 at 03:28AM.)  Result Comment TROPONIN - RESULTS VERIFIED BY REPEAT TESTING.  - ELEVATED TROPONIN PREVIOUSLY CALLED AT  - 1912 11/21/13.PMH  Result(s) reported on 22 Nov 2013 at 03:38AM.  Glucose, Serum 78  BUN  37  Creatinine (comp)  2.41  Sodium, Serum 137  Potassium, Serum  3.3  Chloride, Serum 98  CO2, Serum 30  Calcium (Total), Serum  7.9  Anion Gap 9  Osmolality (calc) 281  eGFR  (African American)  30  eGFR (Non-African American)  26 (eGFR values <79m/min/1.73 m2 may be an indication of chronic kidney disease (CKD). Calculated eGFR is useful in patients with stable renal function. The eGFR calculation will not be reliable in acutely ill patients when serum creatinine is changing rapidly. It is not useful in  patients on dialysis. The eGFR calculation may not be applicable to patients at the low and high extremes of body sizes, pregnant women, and vegetarians.)  07-Sep-15 03:52   Ammonia, Plasma 21 (Result(s) reported on 23 Nov 2013 at 04:32AM.)  Cardiac:  06-Sep-15 02:23   Troponin I  1.60 (0.00-0.05 0.05 ng/mL or less: NEGATIVE  Repeat testing in 3-6 hrs  if clinically indicated. >0.05 ng/mL: POTENTIAL  MYOCARDIAL INJURY. Repeat  testing in 3-6 hrs if  clinically indicated. NOTE: An increase or decrease  of 30% or more on serial  testing suggests a  clinically important change)  CPK-MB, Serum 3.6 (Result(s) reported on 22 Nov 2013 at 03:33AM.)  Routine Hem:  06-Sep-15 02:23   WBC (CBC) 7.4  RBC (CBC)  3.60  Hemoglobin (CBC)  9.5  Hematocrit (CBC)  29.3  Platelet Count (CBC) 266  MCV 82  MCH 26.3  MCHC 32.2  RDW  20.8  Neutrophil % 57.3  Lymphocyte % 22.9  Monocyte % 14.7  Eosinophil % 3.3  Basophil % 1.8  Neutrophil # 4.3  Lymphocyte # 1.7  Monocyte #  1.1  Eosinophil # 0.2  Basophil # 0.1 (Result(s) reported on 22 Nov 2013 at 02:51AM.)   Radiology Results: XRay:    05-Sep-15 19:51, Chest Portable Single View  Chest Portable Single View   REASON FOR EXAM:    ALTERED MENTAL STATUS ABNORMAL TROPONIN  COMMENTS:       PROCEDURE: DXR - DXR PORTABLE CHEST SINGLE VIEW  - Nov 21 2013  7:51PM     CLINICAL DATA:  Altered mental status.    EXAM:  PORTABLE CHEST - 1 VIEW    COMPARISON:  09/22/2013 and 06/13/2013    FINDINGS:  Left-sided pacemaker is unchanged. Sternotomy wires are unchanged  which fracture of the most superior wire. A  lordotic technique is  demonstrated as the lungs are adequately inflated. There is mild  prominence of the perihilar markings suggesting mild vascular  congestion. There is continued minimal blunting of the right  costophrenic angle suggesting a small moderate pleural fluid.  Minimal stable increased interstitial markings over the lateral left  base. Stable mild cardiomegaly. Remainder the exam is unchanged.     IMPRESSION:  Findings suggesting minimal vascular congestion with a small amount  of right pleural fluid unchanged.      Electronically Signed    By: Marin Olp M.D.    On: 11/21/2013 20:50     Verified By: Pearletha Alfred, M.D.,  CT:    05-Sep-15 19:50, CT Head Without Contrast  CT Head Without Contrast   REASON FOR EXAM:    confusion  COMMENTS:       PROCEDURE: CT  - CT HEAD WITHOUT CONTRAST  - Nov 21 2013  7:50PM     CLINICAL DATA:  Confusion, altered mental status.    EXAM:  CT HEAD WITHOUT CONTRAST    TECHNIQUE:  Contiguous axial images were obtained from the base of the skull  through the vertex without intravenous contrast.    COMPARISON:  10/25/2013  FINDINGS:  There is no evidence of acute cortical infarct, intracranial  hemorrhage, mass, midline shift, or extra-axial fluid collection.  Focal hypodensity in the anterior right corona radiata is unchanged  and suggestive of an old lacunar infarct. Patchy hypodensities in  the periventricular white matter elsewhere are similar to the prior  study and nonspecific but compatible with mild to moderate chronic  small vessel ischemic disease. Ventricles and sulci are within  normal limits for age.    Orbits are unremarkable. Left-sided scalp hematoma near the vertex  has nearly completely resolved. There are small, right greater than  leftmastoid effusions, similar to slightly increased from prior.  Minimal left maxillary sinus mucosal thickening is noted. Moderate  atherosclerotic carotid siphon  calcification is noted. No skull  fracture is seen.     IMPRESSION:  1. No acute intracranial abnormality.  2. Mild-to-moderate chronic small vessel ischemic disease.      Electronically Signed    By: Logan Bores    On: 11/21/2013 20:51         Verified By: Ferol Luz, M.D.,   Assessment/Plan:  Assessment/Plan:  Assessment IMP  congestive heart failure  hypertension  altered mental status  obesity  hepatic encephalopathy  coronary disease  COPD .   Plan PLAN  restart lactulose to help with encephalopathy  continue blood pressure support  continue therapy for fluid retention  coronary disease appears to be stable continue secondary prevention  continue heart failure therapy with diuretics  continue Lipitor for hyperlipidemia  physical therapy and to help with mobility and strength training  medical therapy for now do not recommend any further cardiac studies at this point   Electronic Signatures: Lujean Amel D (MD)  (Signed 07-Sep-15 14:00)  Authored: Chief Complaint, VITAL SIGNS/ANCILLARY NOTES, Brief Assessment, Lab Results, Radiology Results, Assessment/Plan   Last Updated: 07-Sep-15 14:00 by Lujean Amel D (MD)

## 2014-07-10 NOTE — Consult Note (Signed)
I have seen and examined Mr Rodney Eaton and agree with Wilhelmenia BlaseKaryn Earle's a/p.  he may have cirrhosis,  based on the u/s and the labs, the cirrhosis is very well compensated.  Doubt his constelation of symptoms related to the cirrhosis.   watch Hgb,   EGD as inpt only if drop in Hgb or melena or hematochezia. low Na dietEGD as outpt to eval for varices. f/u hepatitis serologiesGI clinic f/u.   Electronic Signatures: Dow Adolphein, Jhamal Plucinski (MD)  (Signed on 09-Jul-15 16:39)  Authored  Last Updated: 09-Jul-15 16:39 by Dow Adolphein, Swain Acree (MD)

## 2014-07-10 NOTE — Consult Note (Signed)
Chief Complaint:  Subjective/Chief Complaint Patient denies any problems today. Says he is feeling well. Has cirrhosis and questionable HE. He is beiong treated with lactulose and reports one loose stool last night but none since then.   VITAL SIGNS/ANCILLARY NOTES: **Vital Signs.:   11-Jul-15 04:15  Vital Signs Type Routine  Temperature Temperature (F) 97.9  Celsius 36.6  Temperature Source oral  Pulse Pulse 60  Respirations Respirations 18  Systolic BP Systolic BP 833  Diastolic BP (mmHg) Diastolic BP (mmHg) 59  Mean BP 77  Pulse Ox % Pulse Ox % 90  Pulse Ox Activity Level  At rest  Oxygen Delivery 2L   Brief Assessment:  GEN well developed, well nourished, no acute distress   Respiratory normal resp effort   Gastrointestinal Normal   Gastrointestinal details normal Soft  Nontender   Lab Results: Routine Chem:  11-Jul-15 07:23   Ammonia, Plasma  39 (Result(s) reported on 26 Sep 2013 at 08:18AM.)  Glucose, Serum  108  BUN  47  Creatinine (comp)  1.40  Sodium, Serum  128  Potassium, Serum 4.3  Chloride, Serum  96  CO2, Serum 27  Calcium (Total), Serum  8.4  Anion Gap  5  Osmolality (calc) 270  eGFR (African American)  57  eGFR (Non-African American)  49 (eGFR values <94m/min/1.73 m2 may be an indication of chronic kidney disease (CKD). Calculated eGFR is useful in patients with stable renal function. The eGFR calculation will not be reliable in acutely ill patients when serum creatinine is changing rapidly. It is not useful in  patients on dialysis. The eGFR calculation may not be applicable to patients at the low and high extremes of body sizes, pregnant women, and vegetarians.)  Routine Hem:  11-Jul-15 07:23   Hemoglobin (CBC)  7.4 (Result(s) reported on 26 Sep 2013 at 07:45AM.)   Assessment/Plan:  Assessment/Plan:  Assessment Cirrhosis and questionsable HE.   Plan Patient doing well now.  Will continue the lactulose and adjust if he has diarrhea or  not moving his bowels with 2 formed stools a day.   Electronic Signatures: WLucilla Lame(MD)  (Signed 11-Jul-15 09:50)  Authored: Chief Complaint, VITAL SIGNS/ANCILLARY NOTES, Brief Assessment, Lab Results, Assessment/Plan   Last Updated: 11-Jul-15 09:50 by WLucilla Lame(MD)

## 2014-07-10 NOTE — Discharge Summary (Signed)
PATIENT NAME:  Rodney Eaton, Rodney Eaton MR#:  [id number] DATE OF BIRTH:  [birth year]  DATE OF ADMISSION:  09/23/2013 DATE OF PLANNED DISCHARGE:  09/30/2013   DISCHARGE DIAGNOSES:  1. Cirrhosis with hepatic encephalopathy.  2. Hyponatremia.  3. Congestive heart failure secondary to decreased left ventricular function as well as his cirrhosis.  4. 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  5. Hypothyroid.  6. Depression and weakness.   DISCHARGE MEDICATIONS: Per [facility] med reconciliation. Please see for details.   HISTORY AND PHYSICAL: Please see detailed history and physical done on admission.   HOSPITAL COURSE: The patient was admitted and followed by Dr. Ola Spurr, his regular primary care doctor, who went out of town the past few days, and I saw him. He continued to be anemic, did not seem to progress, was never under a hemoglobin of 7, so was not transfused by me. Dr. Ma Hillock followed him and did followup on the bone marrow biopsy, as noted above, yesterday. Stools have been guaiac-negative. He had an M spike on his SPEP, although no signs of myeloma on the bone marrow biopsy. His hyponatremia improved some with fluid restriction. Holding the loop diuretic did seem to help, and we gave a dose yesterday, with worsening back to 127. This needs to be followed. MET-B and CBC should be done next week, as noted in the discharge day form/orders. He had no chest pain. His blood pressure, however, was somewhat low. Given his history of depression, low blood pressure and cardiomyopathy, will hold his methyldopa so we can give him the metoprolol and lisinopril. Blood pressures systolic were in the 59M and 100s. This should be watched closely and titrate the above meds as needed and again being careful with his diuretics. It was not thought he could care for himself at home. He was quite weak. Physical therapy, care management and myself all  recommended rehab at a skilled facility temporarily, and that is being arranged presently.   TIME SPENT: Please note, it took approximately 35 minutes to do discharge tasks today.  ____________________________ Ocie Cornfield. Ouida Sills, MD mwa:lb D: 09/30/2013 08:00:34 ET T: 09/30/2013 08:17:42 ET JOB#: [id number]  cc: Ocie Cornfield. Ouida Sills, MD, <Dictator> Kirk Ruths MD ELECTRONICALLY SIGNED 10/01/2013 8:04

## 2014-07-10 NOTE — H&P (Signed)
PATIENT NAME:  Rodney Eaton, Rodney Eaton MR#:  [id number] DATE OF BIRTH:  04-Jul-1940  DATE OF ADMISSION:  09/22/2013  ADMISSION DIAGNOSES: Weakness, hypoxia and melena.   HISTORY OF PRESENT ILLNESS: This is a 74 year old gentleman with a history of multiple medical problems, including CHF, COPD, chronic respiratory failure on chronic O2, as well as atrial fibrillation, diabetes, obesity, hypothyroidism, depression and coronary artery disease. He has had a progressive decline over the last month. He has had slowly decreasing hemoglobin despite outpatient workup. He was seen over the last several days by hem/onc and underwent a bone marrow biopsy today. He presented to clinic quite weak and unable to walk. He had massive edema and was having shortness or breath and orthopnea. He also reported dark black stools several times over the last several days. He is being admitted for further workup and evaluation.   PAST MEDICAL HISTORY:  1. COPD, chronic respiratory failure, on 2 to 3 liters at home.  2. Coronary artery disease, status post coronary artery bypass surgery in 1998 as well as stenting.  3. CHF, EF about 40%.  4. Hypertension.  5. Sinus bradycardia with pacemaker placement.  6. Chronic sinusitis.  7. History of Afib, status post ablation.  8. Diabetes.  9. Hyperlipidemia.  10. Hypothyroidism.  11. Obesity.  12. Anxiety.  13. Depression.   PAST SURGICAL HISTORY:  1. Recent gallbladder surgery in February of this year at [facility].  2. Coronary artery bypass graft.  3. Skin cancer surgery.  4. AV nodal ablation surgery.  5. Lumbar laminectomy with residual weakness.   ALLERGIES: No known drug allergies.   SOCIAL HISTORY: A 40-pack-year smoking history but quit. No alcohol. He is divorced and has 2 children.   FAMILY HISTORY: Positive for coronary artery disease with a strong history in his father and brothers.   REVIEW OF SYSTEMS: Eleven systems were reviewed and negative except as per HPI.     MEDICATIONS: Outpatient medications are reviewed.  1. Tramadol 50 mg 2 tablets twice a day.  2. Tylenol Arthritis.  3. Lisinopril 10 mg once a day.  4. Nitroglycerin sublingual.  5. Gabapentin 300 two times a day.  6. Citalopram 40 mg once a day.  7. Atorvastatin 80 mg once a day.  8. Ambien 10 mg once a day.  9. Metoprolol 25 mg 2 times a day.  10. Lasix 80 mg once a day.  11. Methylcellulose 500 mg once a day.  12. Senna Plus 2 tablets once a day.  13. Fluticasone 1 puff twice a day.  14. Levothyroxine 88  once a day.  15. Advair 1 puff twice a day.   PHYSICAL EXAMINATION:  VITAL SIGNS: Temperature 98.1, pulse 65, blood pressure 119/72, respirations 18, sat 98% on 2 liters.  GENERAL: He is obese. He is in respiratory distress.  HEENT: Pupils equal, round, reactive to light and accommodation. Extraocular movements are intact. Sclerae anicteric. Oropharynx IS clear.  NECK: Supple.  HEART: Regular with distant heart sounds.  LUNGS: Poor breath sounds bilaterally. Decreased breath sounds bilateral bases.  ABDOMEN: Obese, distended.  EXTREMITIES: Edema 3+ bilateral lower extremities.  NEUROLOGIC: He is alert and oriented x 3. Grossly nonfocal neuro exam.   DATA: Creatinine is 1.91, BUN 56, sodium 118, potassium 3.3, chloride 79. Albumin 2.7, alk phos 147. White count is 9.9, hemoglobin 8.8, platelets 327.   IMAGING: Chest x-ray reveals stable interstitial findings with pulmonary edema and small right pleural effusion. Bone survey done July 2nd shows no lytic  lesions and no evidence of metastatic lesions to the bone. Echocardiogram done 06/11/2013 shows an EF of 50% to 55%, mild mitral valve regurg, moderate elevated pulmonary systolic pressures and mild to moderate tricuspid regurg.   IMPRESSION: A 74 year old with multiple medical problems with anemia of progressive decline and progressive profound anemia without etiology. He underwent bone marrow biopsy today.  1. Anemia: Dr.  Ma Hillock has done a bone marrow biopsy. Will await results of that.  2. Possible melena per history: His hemoglobin is stable. Will continue to follow it and checkheme  stools. Place him on a proton pump inhibitor.  3. Hyponatremia: This is quite profound. Sodium at discharge May 29th was 133. Creatinine at that time was 1.15. At this point, he is massively volume overloaded with edema. Will consult renal. Will also consult cardiology. I am going to hold his Lasix at this time. Will give him some gentle fluid for the tonicity, while knowing that we will need to diurese him some.  4. Coronary artery disease: Continue blood pressure and lipid control.  5. Dyspnea: This is likely due to volume overload. Will consult cardiology.  6. Hypothyroidism: Continue levothyroxine and check TSH.  7. Deep vein thrombosis prophylaxis: Place on subcutaneous heparin.   ____________________________ Cheral Marker. Ola Spurr, MD dpf:gb D: 09/22/2013 21:42:30 ET T: 09/22/2013 22:16:05 ET JOB#: [id number]  cc: Cheral Marker. Ola Spurr, MD, <Dictator> Andjela Wickes Ola Spurr MD ELECTRONICALLY SIGNED 10/05/2013 21:23

## 2014-07-10 NOTE — H&P (Signed)
PATIENT NAME:  Rodney Eaton, Rodney Eaton MR#:  [id number] DATE OF BIRTH:  11/12/1940  DATE OF ADMISSION:  06/11/2013  PRIMARY CARE PHYSICIAN: Dr. Sampson Goon CARDIOLOGIST: Dr. Gwen Pounds  PULMONOLOGIST: Dr. Meredeth Ide  HISTORY OF PRESENT ILLNESS:  The patient is a 74 year old Caucasian male with past medical history significant for history of multiple medical problems, who presents to the hospital as a direct admit due to increasing and progressively worsening shortness of breath over the past 1 month. According to the patient, he was doing well up until approximately 1 month ago when he started having noticeable shortness of breath. He gets very short of breath whenever he moves around. He also admits to orthopnea, also PND as well as dyspnea on exertion as well as lower extremity swelling. He denies any significant weight gain.  He has been using Lasix 40 mg twice daily, however, with no significant improvement of his lower extremity swelling according to the patient. He has been also using progressively more oxygen. He has been now using 2 or 3 L of oxygen through nasal cannula at home. Because of that, he was sent by Dr. Meredeth Ide to the hospital as a direct admit because of concern of possible worsening right pleural effusion. Hospitalist services were contacted for admission.   PAST MEDICAL HISTORY: Significant for history of multiple medical problems including chronic respiratory failure on 2 as well as 3 L of oxygen through nasal cannula at home, history of gallbladder surgery just a few weeks ago on 05/03/2013 at [facility] which was open surgery. Also the patient had status post pacemaker placement for sinus node dysfunction, bradycardia in April 2010, history of COPD, chronic sinusitis, history of atrial fibrillation status post ablation, history of diabetes mellitus, diet controlled, hyperlipidemia, hypothyroidism, obesity, anxiety, depression, coronary artery disease status post coronary  artery bypass grafting in 1998 as well as stents in the past, history of normal left ventricular function in the past; however, apparently he had echocardiogram recently which  revealed ejection fraction of 40%. However, I cannot see any echocardiograms reported here on the hospital web site.    The patient did have cardiac catheterization in March 2014. At that time, the patient had patent stents. History of hypertension, tobacco abuse for 40 years, 40-pack-years, stopped 1990s, then restarted in 2000, and then smoked till 2001. Degenerative arthritis, sleep apnea intolerant to CPAP, history of cardiac arrest in February 2004 resulting in stent placement, history of skin cancer on nose which was operated, obesity, gallstones, right pleural effusion in the past, history of renal insufficiency and creatinine level in February 2015 of 1.5, restless leg syndrome   PAST SURGICAL HISTORY: Coronary artery bypass graft in 1998; AV node ablative therapy at [facility]; lumbar laminectomy with residual weakness; sleep apnea, intolerant to CPAP; multiple cardioversions for AFib; status post cardiac arrest in February 2004 resulting in stenting; status post excision of skin cancer of the nose.   ALLERGIES: No known drug allergies.   MEDICATIONS: According to medical records, the patient is on Advair Diskus 250/50 one  puff twice daily, Ambien 10 mg p.o. at bedtime as needed, aspirin 81 mg two tablets daily, atorvastatin 40 mg p.o. daily, citalopram 40 mg p.o. daily, Fiber-Lax 325 mg two tablets once daily at bedtime, furosemide 40 mg twice daily, metoprolol titrate 25 mg twice daily, stool softener 2 tablets once at bedtime, and tramadol 50 mg two tablets 3 times daily. In the past, the patient was also on Altace, and I am  not sure this medication is still being used.   SOCIAL HISTORY: A 40-pack-year smoking. No longer smokes. Alcohol history is none. Divorced, has 2 children.   FAMILY HISTORY:  Significant for underlying coronary disease, and the patient's father died of myocardial infarction. The patient's 2 brothers died of heart attacks. One brother also had coronary artery bypass grafting.   REVIEW OF SYSTEMS: Positive for fatigue and weakness, some blurring of vision for which he uses bifocal glasses. Some nose problems, some sinus congestion, postnasal drip, sinus pains, difficult to tolerate CPAP due to nasal operation in the past. Also cough as well as intermittent expiratory wheezing as well as dyspnea, orthopnea, intermittent arrhythmias. He thinks that this is atrial fibrillation. His pacemaker was interrogated in the past and it revealed some atrial fibrillation. Also it feels that he has intermittent palpitations not related to exertion. Intermittent abdominal pains. Some constipation for which he uses stool softeners and fiber pills. Also intermittent incontinence of urine. Denies any high fevers or chills, pains, weight loss or gain.  EYES: No blurry vision, double vision, glaucoma, or cataracts.  EARS: Denies any tinnitus, allergies, epistaxis, dentures, difficulty swallowing. RESPIRATORY: Denies any hemostasis, asthma, or COPD.  CARDIOVASCULAR: Denies chest pains, edema, or syncope.  GASTROINTESTINAL: Denies diarrhea, vomiting, hematemesis, rectal bleeding, or change in bowel habits.  GENITOURINARY: Denies dysuria, hematuria, frequency.  ENDOCRINOLOGY: Denies any polydipsia, nocturia, thyroid problems, heat or cold intolerance or thirst.  HEMATOLOGIC: Denies anemia, easy bruising, bleeding, or swollen glands.  SKIN: Denies any acne, rashes, lesions, or change in moles.  MUSCULOSKELETAL: Denies arthritis, cramps, swelling.  NEUROLOGICAL: Denies numbness, epilepsy, or tremor.  PSYCHIATRIC: Denies anxiety, insomnia, or depression.    PHYSICAL EXAMINATION:  VITAL SIGNS: On arrival to the hospital, temperature was 98.4, pulse 61, respiration rate 20, blood pressure 144/81,  saturation 92% on 3 L of oxygen nasal cannula. GENERAL: An obese Caucasian male in no significant distress, sitting on the stretcher.  HEENT: Pupils are equal and reactive to light. Extraocular movements are intact.  No icterus or conjunctivitis. Has normal hearing. No pharyngeal erythema. Mucosa is dry.  NECK: No masses, supple, nontender. Thyroid is not enlarged. No adenopathy. No JVD or carotid bruits bilaterally. Full range of motion.  LUNGS: Diminished breath sounds bilaterally at bases. No significant rales, rhonchi, or wheezing. No labored inspirations, increased effort, dullness to percussion.  Apparent respiratory distress.  CARDIOVASCULAR: S1, S2 appreciated.  No murmurs, gallops or rubs. Rhythm is regular.  PMI not lateralized. Chest is nontender to palpation.  EXTREMITIES:  1+ pedal pulses.   2+ lower extremity edema. No calf tenderness or cyanosis was noted.  ABDOMEN: Soft and nontender. Bowel sounds are present. No hepatosplenomegaly or masses were noted. The patient does have bandage dressing on the right upper quadrant, recent operation, status post cholecystectomy, and mild discomfort in that area was noted.  RECTAL: Deferred.  MUSCULOSKELETAL:  Muscle strength,  able to move all extremities. No cyanosis, degenerative joint disease or kyphosis.  Gait was not tested.  SKIN: Skin did not reveal any rashes, lesions, erythema, nodularity, induration. It was warm and dry to palpation.  LYMPH: No adenopathy in the cervical region.  NEUROLOGICAL: Cranial nerves are grossly  intact. Sensory is intact. No dysarthria or aphasia. The patient is alert, oriented to time, person, place, cooperative. Memory is good. No significant confusion, agitation, or depression.   DIAGNOSTIC DATA:  EKG is pending.  Chest x-ray is pending.  LABORATORIES:  BMP showed glucose of 114, BUN of  19, sodium 134; otherwise, BMP was unremarkable. Calcium level was 8.2, otherwise unremarkable. Liver enzymes: Albumin  level of 2.8, alkaline phosphatase 118. Cardiac enzymes, first set, negative. White blood cell count elevated to 12.1. Hemoglobin was 9.1 and platelet count was 279. Absolute neutrophil count was 8.9. Pro time was 15.7 and INR was 1.3.   ASSESSMENT AND PLAN:  1.  Dyspnea of unclear etiology at this time. Get chest x-ray repeated and very likely congestive heart failure. Will start IV Lasix. Follow ins and outs. If the patient's chest x-ray reveals significant pleural effusion, we may need to have thoracentesis done.  2.  Lower extremity swelling. Get Doppler ultrasound to rule out deep vein thrombosis.  3.  Obstructive sleep apnea. I had a long discussion about possible need to consider CPAP; however, apparently that is not that easy to tolerate since his nasal surgery.  4.  Diabetes mellitus. Continue diabetic diet as well as sliding-scale insulin. Get hemoglobin A1c.  5.  Hypertension. Resume home medications. Also add nitroglycerin topically to facilitate diuresis as well as low dose of hydralazine.  6.  Leukocytosis. Get urinalysis as well as chest x-ray, but no signs of infection, possibly stress-related.  7.  Restless leg syndrome. Start Mirapex.   TOTAL TIME SPENT: One hour.   ____________________________ Katharina Caperima Aeneas Longsworth, MD rv:np D: 06/11/2013 17:58:05 ET T: 06/11/2013 19:23:32 ET JOB#: [id number]  cc: Katharina Caperima Aja Whitehair, MD, <Dictator> Dr. Ivan AnchorsFitzgerald  Daielle Melcher MD ELECTRONICALLY SIGNED 06/23/2013 21:18

## 2014-07-10 NOTE — Consult Note (Signed)
Brief Consult Note: Diagnosis: AMS/CHF/CRI/Elevated troponins.   Patient was seen by consultant.   Consult note dictated.   Recommend further assessment or treatment.   Orders entered.   Discussed with Attending MD.   Comments: IMP AMS Weakness CHF SOB CAD COPD Hypoxemia HTN SSS AFIB DM Hepatic Encephalopathy  . PLAN Lactulose for encephalopathy Physical therapy Inhalers for COPD 02 2-3 L [place] for hypoxemia  Continue DM control Elevated troponins probabbly demand ischemia-> medical therapy fro now Follow up renal insuff with nephollogy Agree with lipitor for lipids.  Electronic Signatures: Dorothyann Pengallwood, Dwayne D (MD)  (Signed 07-Sep-15 07:25)  Authored: Brief Consult Note   Last Updated: 07-Sep-15 07:25 by Alwyn Peaallwood, Dwayne D (MD)

## 2014-07-10 NOTE — H&P (Signed)
PATIENT NAME:  Rodney Eaton, Ashwath E MR#:  [id number] DATE OF BIRTH:  Aug 05, 1940  DATE OF ADMISSION:  11/21/2013  PRIMARY CARE PROVIDER: Dr. Sampson GoonFitzgerald.   EMERGENCY DEPARTMENT REFERRING PHYSICIAN: Dr. Shaune PollackLord.   CHIEF COMPLAINT: Decrease in responsiveness, weakness, falls.   HISTORY OF PRESENT ILLNESS: The patient is a 74 year old white male with history of multiple medical problems including chronic systolic CHF, COPD, chronic respiratory failure, atrial fibrillation, diabetes, morbid obesity, hypothyroidism, depression, coronary artery disease, who was recently hospitalized on 09/22/2013. At that time he was also diagnosed with liver cirrhosis with hepatic encephalopathy. The patient was discharged to a skilled nursing facility and was taken home by his daughter on July 27. Since going home, he has had home hospice following him and he is brought in because of progressive worsening of his condition with falls, Also over the past few days he has been very confused and has been sleeping most of the day. During his previous hospitalization, he was on lactulose that he was discharged home on; however, according to the daughter, the hospice nurse recommended that patient be stopped on lactulose and be on a stool softener. The patient currently is very sleepy. He does open his eyes, but is not able to give me any history. They report that he has not had any fevers or chills. The patient also was noted to have an elevated troponin as well in the ED.   PAST MEDICAL HISTORY:  1. COPD. 2. Chronic respiratory failure on 2-3 liters of oxygen at home.  3. Coronary artery disease, status post CABG in 1998 as well as stenting placed. 4. Chronic systolic congestive heart failure.  5. Hypertension.  7. Sinus bradycardia with pacemaker placement.  8. Chronic sinusitis.  9. History of atrial fibrillation, status post ablation.  10. Diabetes.  11. Hyperlipidemia.  12. Hypothyroidism.  13. Morbid obesity.  14.  Anxiety.  15. Depression.  16. Recent diagnosis of liver cirrhosis with hepatic encephalopathy.   PAST SURGICAL HISTORY:  1. Status post cholecystectomy.  2. Status post CABG.  3. Status post skin cancer surgery.  4. Status post AV nodal ablation.  5. Status post lumbar laminectomy with residual weakness.   ALLERGIES: None.   SOCIAL HISTORY: Smoked 40 pack-years but quit. No alcohol.   FAMILY HISTORY: Positive for coronary artery disease with strong history in his father and brothers.   REVIEW OF SYSTEMS: Unobtainable due to the patient being confused.   MEDICATION: List is currently not available. The medications that he was last discharged on home was: 1. Tramadol 50 two tabs b.i.d.  2. ProAir 2 puffs 4 times a day.  3. Omeprazole 20 one tab p.o. b.i.d. 4. Nitroglycerin 0.4 sublingual p.r.n. 5. Metoprolol succinate 50 mg 1 tab p.o. b.i.d. 6. Lisinopril 10 daily. 7. Levothyroxine 88 mcg daily.  8. Lasix 80 daily.  9. Lactulose 30 mL b.i.d. 10. Gabapentin 300 two caps b.i.d. 11. Fluticasone salmeterol 1 puff b.i.d. 12. Citalopram 40 daily.  13. Atorvastatin 80 at bedtime.   PHYSICAL EXAMINATION:  VITAL SIGNS: Temperature 97.9, pulse 70, respirations 16, blood pressure 140/65.  GENERAL: The patient is a morbidly obese, chronically ill-appearing male.  HEENT: Head atraumatic, normocephalic. Pupils equally round, reactive to light and accommodation. He has a bruising on his face from the fall. Nasal exam shows no drainage or ulceration. External ear exam showed no erythema or drainage.  NECK: Supple without any JVD.  CARDIOVASCULAR: Regular rate and rhythm. No murmurs, rubs, clicks, or gallops.  LUNGS: Clear  to auscultation bilaterally without any rales, rhonchi, wheezing.  ABDOMEN: Soft. No guarding. No rebound. No hepatosplenomegaly.  EXTREMITIES: He has got 2+ edema.  SKIN: He has got multiple bruising in different areas from the falls.  LYMPHATIC: Lymph nodes  nonpalpable. VASCULAR: Good DP, PT pulses.  PSYCHIATRIC: The patient is not anxious. He is not oriented to place, person, or time.  NEUROLOGICAL: The patient is mostly drowsy. When he opens his eyes he is able to answer only a few questions. Spontaneously moving all extremities.   LABORATORY EVALUATIONS: Glucose 98, BUN 36, creatinine 2.39, sodium 137, potassium 3.4, chloride 98, CO2 of 32, calcium 7.9. LFTs showed albumin 2.4. Troponin 2.10, WBC 7.3, hemoglobin 9.1, platelet count 263,000. INR is 1.5. CT scan of the head without contrast shows mild-to-moderate chronic small vessel ischemic changes.   ASSESSMENT AND PLAN: The patient is a 74 year old white male with history of chronic obstructive pulmonary disease, congestive heart failure, liver cirrhosis, history of hepatic encephalopathy who is brought in with altered mental status.  1. Acute encephalopathy, possibly multifactorial possible hepatic encephalopathy at this time. Ammonia level is pending. Will give him lactulose retention enema. Will get physical therapy, occupational therapy evaluation and treatment.  2. Elevated troponin, possible myocardial infarction. Medical management will treat him with aspirin. We will ask his cardiologist to see likely not a candidate for any intervention.  3. History of congestive heart failure. He will not be able to take any oral medications. I will place him on Lasix IV b.i.d.  4. Chronic renal failure. Renal function seems to be close to baseline.  5. Hyperlipidemia. We will hold p.o. medications for now.  6. Chronic obstructive pulmonary disease with chronic respiratory failure. Continue oxygen. We will do p.r.n. nebulizers.  6. Hypothyroidism. Synthroid once the patient able to take p.o. meds.   CODE STATUS: Discussed with the family. They wish him to be DNR. Also, he is progressively worse, and has multiorgan failures. They feel that if he does not show improvement in the next 24-48 hours they would  be leaning more towards Hospice Home. I will ask palliative care to assist with this decision.  TIME SPENT ON THIS PATIENT: 60 minutes.   ____________________________ Lacie Scotts Allena Katz, MD shp:lt D: 11/21/2013 21:11:09 ET T: 11/21/2013 21:49:42 ET JOB#: [id number]  cc: Jalyn Rosero H. Allena Katz, MD, <Dictator> Charise Carwin MD ELECTRONICALLY SIGNED 12/04/2013 8:37

## 2014-07-10 NOTE — Consult Note (Signed)
Chief Complaint:  Subjective/Chief Complaint Patient reports feeling well today. Knows where he is, the year and month but was wrong with the day of the month. Having soft bowel movements.   VITAL SIGNS/ANCILLARY NOTES: **Vital Signs.:   12-Jul-15 05:05  Vital Signs Type Routine  Temperature Temperature (F) 98.4  Celsius 36.8  Temperature Source oral  Pulse Pulse 62  Respirations Respirations 20  Systolic BP Systolic BP 030  Diastolic BP (mmHg) Diastolic BP (mmHg) 57  Mean BP 74  Pulse Ox % Pulse Ox % 92  Pulse Ox Activity Level  At rest  Oxygen Delivery 2L   Brief Assessment:  GEN well developed, well nourished, no acute distress   Respiratory normal resp effort   Gastrointestinal details normal Soft  Nontender   Additional Physical Exam Alert. A slight flapping tremor.   Lab Results: Routine Chem:  12-Jul-15 06:46   Glucose, Serum 91  BUN  42  Creatinine (comp)  1.58  Sodium, Serum  130  Potassium, Serum 4.8  Chloride, Serum 98  CO2, Serum 23  Calcium (Total), Serum  8.1  Anion Gap 9  Osmolality (calc) 271  eGFR (African American)  50  eGFR (Non-African American)  43 (eGFR values <50m/min/1.73 m2 may be an indication of chronic kidney disease (CKD). Calculated eGFR is useful in patients with stable renal function. The eGFR calculation will not be reliable in acutely ill patients when serum creatinine is changing rapidly. It is not useful in  patients on dialysis. The eGFR calculation may not be applicable to patients at the low and high extremes of body sizes, pregnant women, and vegetarians.)   Assessment/Plan:  Assessment/Plan:  Assessment Cirrhosis with HE.   Plan Doing well on lactulose.  Continue current treatment. Dr. RRayann Hemanto resume care tomorrow.   Electronic Signatures: WLucilla Lame(MD)  (Signed 12-Jul-15 10:10)  Authored: Chief Complaint, VITAL SIGNS/ANCILLARY NOTES, Brief Assessment, Lab Results, Assessment/Plan   Last Updated:  12-Jul-15 10:10 by WLucilla Lame(MD)

## 2014-07-10 NOTE — Consult Note (Signed)
PATIENT NAME:  Rodney Eaton, Rodney Eaton MR#:  [id number] DATE OF BIRTH:  Jul 19, 1940  DATE OF CONSULTATION:  09/29/2013  REFERRING PHYSICIAN:  Dr. Frazier Richards CONSULTING PHYSICIAN:  Lysander Calixte R. Ma Hillock, MD  REASON FOR CONSULTATION:  Anemia, had recent bone marrow biopsy.   HISTORY OF PRESENT ILLNESS:  The patient is a 74 year old gentleman with past medical history significant for multiple issues including chronic obstructive pulmonary disease with chronic respiratory failure on oxygen, congestive heart failure, atrial fibrillation, diabetes, hypothyroidism, obesity, coronary artery disease, depression.  In addition, he has had mildly elevated creatinine on and off.  He was recently evaluated in detail for progressive symptomatic anemia and work-up that indicated evidence of iron deficiency along with the presence of monoclonal protein on SIEP.  He had bone marrow biopsy done which does not reveal evidence of myeloma, there was 2% clonal B cell population indicative of possible early lymphoproliferative disorder.  The patient is admitted with progressive weakness and dyspnea, creatinine is fluctuating between 1.58 to 1.6.  CBC done earlier today shows hemoglobin still low at 7 grams, WBC 11,500, platelets 224.  He also had ultrasound abdomen on July 8th which is showing enlarged liver of 19 cm with possible mild cirrhosis or hepatic steatosis, equivocal focal findings, no ascites on ultrasound, spleen normal in size measuring 7.3 cm.  Clinically, the patient states that he continues to have significant dyspnea on minimal activity, continues on oxygen.  Denies any obvious bleeding symptoms.   PAST MEDICAL HISTORY:  As in HPI.   FAMILY HISTORY:  Remarkable for heart disease, denies hematological disorders or malignancy.   SOCIAL HISTORY:  Ex-smoker, 40 pack-year history.  Denies alcohol usage.   ALLERGIES:  No known drug allergies.   HOME MEDICATIONS:  Lisinopril 10 mg daily, sublingual nitroglycerin as  needed, gabapentin 300 mg twice daily, citalopram 40 mg daily, atorvastatin 80 mg daily, Ambien 10 mg daily, metoprolol 25 mg twice daily, Lasix 80 mg daily, methylcellulose 500 mg daily, senna Plus 2 tablets once daily, tramadol 50 mg 2 tablets twice daily, Tylenol Arthritis as needed, fluticasone 1 puff twice daily, levothyroxine 88 mcg daily, Advair 1 puff twice daily.   REVIEW OF SYSTEMS: CONSTITUTIONAL:  As in HPI.  HEENT:  Intermittent dizziness.  No headaches, epistaxis, ear or jaw pain.  CARDIAC:  As in HPI.  Has orthopnea, denies PND.  LUNGS:  Has chronic cough and dyspnea.  No hemoptysis or chest pain.  GASTROINTESTINAL:  Currently denies nausea, vomiting, or diarrhea.  No bright red blood in stools or melena.  GENITOURINARY:  No dysuria or hematuria.  MUSCULOSKELETAL:  Has generalized aches and pains, arthritis, no new bone pains.  SKIN:  No new rashes or pruritus.  NEUROLOGIC:  No new focal weakness or seizures.  ENDOCRINE:  No polyuria or polydipsia.  Appetite is fairly steady.   PHYSICAL EXAMINATION: GENERAL:  The patient is elderly, weak-looking, on nasal cannula oxygen, otherwise alert and oriented in no acute distress at rest.  No icterus.  Pallor present.  VITAL SIGNS:  96.5, 73, 18, 97/64, 91% on 3 liters.  HEENT:  Normocephalic, atraumatic.  Extraocular movements intact.  Sclerae anicteric.  NECK:  Negative for lymphadenopathy.  CARDIOVASCULAR:  S1, S2, regular rate and rhythm.  LUNGS:  Bilateral diminished breath sounds, especially at bases, no rhonchi.  ABDOMEN:  Soft, obese, no hepatosplenomegaly clinically.  EXTREMITIES:  Bilateral edema.  NEUROLOGIC:  Limited examination, cranial nerves intact, moves all extremities spontaneously.   LABORATORY DATA:  As in history  of present illness.   IMPRESSION AND RECOMMENDATIONS:  A 74 year old gentleman with history of multiple medical problems and progressive symptomatic anemia.  He has had recent detailed work-up for anemia  and it is likely multifactorial in his case, including iron deficiency anemia along with anemia of chronic disease (given renal insufficiency and possible evidence of chronic liver disease on ultrasound).  Otherwise, recent bone marrow biopsy only showed minimal evidence of lymphoproliferative disorder, possibly like chronic lymphocytic leukemia (2% marrow involvement only), but no myeloma despite the fact that he had 1.3 g/dL M-protein on SIEP.  In addition, Congo red stain was done on bone marrow specimen which is negative for any evidence of amyloid. Agree with the patient receiving parenteral iron therapy with Venofer 500 mg IV to try and improve his anemia quicker.  Otherwise, recommend continuing packed red blood cell transfusion support as indicated by hemoglobin (if less than 7 grams) or based on his symptoms.  If anemia does not improve in the near future despite correction of iron deficiency, will then consider ESA therapy like Procrit if he does not have any active ischemic heart disease issues.  The patient and daughter present explained above, also provided with a copy of bone marrow report. If patient is discharged soon, we will call him with outpatient appointment for continued follow-up and management of anemia.  They are agreeable to this plan.   Thank you for the referral, please feel free to contact me if any additional questions.    ____________________________ Rhett Bannister Ma Hillock, MD srp:ea D: 09/29/2013 23:37:47 ET T: 09/30/2013 00:10:16 ET JOB#: [id number]  cc: Trevor Iha R. Ma Hillock, MD, <Dictator> Alveta Heimlich MD ELECTRONICALLY SIGNED 09/30/2013 9:27

## 2014-07-10 NOTE — Discharge Summary (Signed)
PATIENT NAME:  Rodney Eaton, Rodney Eaton MR#:  [id number] DATE OF BIRTH:  06-04-1940  DATE OF ADMISSION:  06/11/2013 DATE OF DISCHARGE:  06/14/2013  DISCHARGE DIAGNOSES: 1.  Diastolic congestive heart failure exacerbation.  2.  Coronary artery disease.  3.  Chronic obstructive pulmonary disease.   DISCHARGE MEDICATIONS: 1.  Advair 250/50, currently on 1 puff daily.  2.  Citalopram 40 mg p.o. daily.  3.  Ambien 10 mg p.o. at bedtime as needed for insomnia.  4.  Atorvastatin 40 mg at bedtime.  5.  Metoprolol tartrate 25 mg 2 tabs b.i.d.  6.  Levothyroxine 75 mcg daily.  7.  Isosorbide mononitrate 30 mg extended-release 1 tab daily.  8.  Aspirin 81 mg daily.  9.  Furosemide 80 mg p.o. b.i.d.  10.  Potassium chloride 20 mEq p.o. b.i.d.  11.  Lisinopril 10 mg p.o. daily.   CONSULTS: None.   PROCEDURES: He had an echocardiogram that showed an ejection fraction of 50% to 55%.   LABORATORY, DIAGNOSTIC AND RADIOLOGICAL DATA:  Ultrasound was negative for DVT on day of discharge. Sodium 133, potassium 3.8, creatinine 1.15, glucose 109, 92% on 2 liters. Chest x-ray showed stable edema.   BRIEF HOSPITAL COURSE: Acute diastolic congestive heart failure exacerbation. The patient initially came in with progressive worsening shortness of breath, currently on 2 liters at home with underlying COPD.  He was diuresed and the shortness of breath did improve. Chest x-ray was consistent with heart failure and edema. Echocardiogram was consistent with diastolic congestive heart failure. We will continue with the beta blocker, added an ACE inhibitor upon discharge and also increased his Lasix to 80 mg b.i.d. Will continue on 2 liters as he was before. He is stable at this time for discharge, will need close followup for his B-MET and also blood pressure.  He was asked to see Dr. Ola Spurr early next week. Other chronic issues remained stable. Continue home regimen at this time.   DISPOSITION: He is in stable condition  and will be discharged to home.   ____________________________ Dion Body, MD kl:cs D: 06/14/2013 10:58:50 ET T: 06/14/2013 19:59:03 ET JOB#: [id number]  cc: Dion Body, MD, <Dictator> Cheral Marker. Ola Spurr, MD Dion Body MD ELECTRONICALLY SIGNED 06/22/2013 10:36

## 2014-07-10 NOTE — Discharge Summary (Signed)
PATIENT NAME:  Rodney Eaton, Rodney Eaton MR#:  [id number] DATE OF BIRTH:  [birth year]  DATE OF ADMISSION:  09/23/2013 DATE OF DISCHARGE:  10/01/2013   Addendum  The patient could not be discharged yesterday as his insurance approval was not there. He was stable otherwise, without any further complications. Labs were stable today. He will be discharged on 3 liters nasal cannula oxygen. Also, it appears he has sleep apnea. This is a known diagnosis. He has a CPAP machine at home, which he does not use all the time, he says. He was strongly advised to get that to the skilled facility he is going to for rehab and to use that nightly there. Advised the skilled facility to please follow up on this as well.    ____________________________ Marya AmslerMarshall W. Dareen PianoAnderson, MD mwa:lb D: 10/01/2013 08:04:09 ET T: 10/01/2013 08:18:09 ET JOB#: [id number]  cc: Marya AmslerMarshall W. Dareen PianoAnderson, MD, <Dictator> Lauro RegulusMARSHALL W Fraidy Mccarrick MD ELECTRONICALLY SIGNED 10/01/2013 11:53

## 2014-07-10 NOTE — Consult Note (Signed)
PATIENT NAME:  Rodney Eaton, Rodney Eaton MR#:  [id number] DATE OF BIRTH:  1940/10/05  DATE OF CONSULTATION:  09/24/2013  REFERRING PHYSICIAN:  Dr. Ola Spurr. CONSULTING PHYSICIAN:  Corky Sox. Zettie Pho, PA-C for Arther Dames, MD.  ATTENDING CONSULTING PHYSICAL:  Dr. Rayann Heman.  REASON FOR CONSULTATION: Newly diagnosis of cirrhosis.   HISTORY OF PRESENT ILLNESS: This is a pleasant 74 year old gentleman accompanied by his daughter Lorenza Chick who was admitted for weakness and hypoxia. He does have a history of COPD for which he requires supplemental oxygen at home, as well as CHF with an ejection fraction of 40%. He has a history of chronic anemia for which he is currently being evaluated by hematology and he is status post bone marrow biopsy. These results are still pending. Upon further work-up of his weakness and hypoxia, he was found to be hyponatremic with a sodium of 122. This has since come up to 124. His hemoglobin was stable at 8.8 yesterday and when rechecked today, this had come down to 7.9. Per report, the patient has mentioned that he has had a history of intermittent black -colored stools. His last bowel movement was yesterday and this was guaiaced and found to be negative. He denies any diarrhea or constipation. There is no abdominal pain. No prior history of liver disease that he can report. While working up the above symptoms, he did undergo an abdominal ultrasound that was consistent with a mildly nodular  coarse echotexture of the liver with hepatomegaly measuring 19.2 cm. No ascites was visualized and his spleen appeared normal. 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 He has been abstinent since that time. He has no known history of hepatitis. There is no fever or chills. No recent travel or sick contacts. He does continue to feel weak and short of breath.   PAST MEDICAL HISTORY: Congestive heart failure with an ejection  fraction of 40%, COPD on 24 hours oxygen, hypertension, coronary artery disease, atrial fibrillation, diabetes, dyslipidemia, anxiety, depression, hypothyroidism, obesity.   PAST SURGICAL HISTORY: Cholecystectomy, CABG with cardiac stent placement, as well. Skin cancer surgery, lumbar laminectomy, pacemaker placement, AV nodal ablation surgery.   ALLERGIES: NO KNOWN DRUG ALLERGIES.   SOCIAL HABITS: The patient denies any current alcohol use, but does admit that he has a remote history of heavy alcohol drinking, about 12-15 years ago. Since that time, he has been abstinent.  He also has a remote history of tobacco use with a 40 pack-year history. He denies any current tobacco use. No illicit drug use.   FAMILY HISTORY: No known family history of GI malignancy, colon polyps or IBD.   HOME MEDICATIONS: Fluticasone, levothyroxine, Advair, methylcellulose, Lasix, senna, Ambien, metoprolol, citalopram, atorvastatin, lisinopril, nitroglycerin, Tylenol, tramadol, and gabapentin.   REVIEW OF SYSTEMS: Ten system review of systems was obtained on the patient. Pertinent positives are mentioned above and, otherwise, negative.   OBJECTIVE:  VITAL SIGNS: Blood pressure 108/69, heart rate 61, respirations 15, temperature 98.1, bedside pulse oximetry 92%.  GENERAL: This is a pleasant 74 year old gentleman resting quietly and comfortably in bed in no acute distress. Alert and oriented x 3.  HEAD: Atraumatic, normocephalic.  NECK: Supple. No lymphadenopathy noted.  HEENT: Sclerae anicteric. Mucous membranes moist.  LUNGS: Respirations are even and unlabored. Clear to auscultation bilateral anterior lung fields although he is currently on a nasal cannula.  ABDOMEN: Soft, nontender, nondistended. Normoactive bowel sounds noted in all 4 quadrants.  No guarding or rebound. No masses, hernias or organomegaly appreciated. However, exam is severely limited secondary to his obese habitus.  EXTREMITIES: Negative for lower  extremity edema,  2+ pulses noted in bilateral upper extremities.  PSYCHIATRIC: Appropriate mood and affect.  NEUROLOGIC: Cranial nerves II-XII are grossly intact.  RECTAL: Deferred.   LABORATORY DATA: White blood cells 8.9, hemoglobin 7.9, hematocrit 23.8, platelets 241,000, MCV 83, TSH 3.01, bilirubin 0.7, ALT 19, AST 28, albumin 2.7, alkaline phosphatase 147. Sodium 124, potassium 3.7, BUN 56, creatinine 1.76, glucose 116, heme negative.   IMAGING: Chest x-ray was obtained on the patient showing stable interstitial findings, likely pulmonary edema. There was also small right pleural effusion.   Abdominal ultrasound was obtained on the patient showing hepatomegaly measuring 19.2 cm. There was also a mildly nodular contour of the liver with coarse echo texture. These findings may reflect cirrhosis, though hepatic steatosis is also possible. His spleen appeared normal. There is no ascites visualized.   ASSESSMENT:  1.  Signs of cirrhosis on abdominal ultrasound with hepatomegaly.  2.  Hyponatremia.  3.  Chronic normocytic anemia.  4.  Possible melena. His stool was heme-negative.  5.  History of congestive heart failure with current dyspnea and volume overload.   PLAN: I have discussed this patient's case in detail with Dr. Arther Dames who is involved in the development of the patient's plan of care. After reviewing the patient's labs and ultrasound findings, this does appear to be a well compensated case of liver cirrhosis. We do not feel that his current symptoms are related to the cirrhosis. We would like to check a hepatitis panel and PT/INR and an alpha-fetoprotein to further classify his cirrhosis. We are awaiting bone marrow biopsy results, which was performed to evaluate his chronic anemia. He was heme-negative and we do recommend continuing to keep a close eye on his hemoglobin due to questionable history of melena. With newly diagnosed cirrhosis, he will be a candidate for an EGD down  the line once he is clinically stable, but this work-up can certainly be completed when he is an outpatient. Should his hemoglobin decline or there is any signs of overt GI bleeding, we can certainly consider doing an EGD while inpatient, but at the present time, it does not appear that his current symptomatology is related to his cirrhosis. This was explained to the patient who verbalized understanding and all questions were answered. We will continue to follow this patient and we do recommend outpatient follow-up after discharge so he can continue to work-up his cirrhosis.   Thank you so much for this consultation and for allowing [place] to participate in the patient's plan of care.    ____________________________ Corky Sox. Amara Justen, PA-C kme:ts D: 09/24/2013 14:53:06 ET T: 09/24/2013 15:12:16 ET JOB#: [id number]  cc: Corky Sox. Srikar Chiang, PA-C, <Dictator> [place] PA ELECTRONICALLY SIGNED 09/24/2013 15:56

## 2014-07-10 NOTE — Consult Note (Signed)
PATIENT NAME:  Rodney Eaton, Rodney Eaton MR#:  [id number] DATE OF BIRTH:  1940/06/14  DATE OF CONSULTATION:  11/22/2013  CONSULTING PHYSICIAN:  Dwayne D. Callwood, MD  INDICATIONS: Altered mental status, congestive heart failure, elevated troponin.   PRIMARY CARE PHYSICIAN: Stann Mainland. Sampson Goon, MD.   CARDIOLOGIST:  Lamar Blinks, MD.  HISTORY OF PRESENT ILLNESS: The patient is a 74 year old white male with history of multiple medical problems including congestive heart failure, COPD, chronic respiratory failure, atrial fibrillation, diabetes, obesity, hypothyroidism, depression, coronary artery disease, recently hospitalized in July for liver cirrhosis and hepatic encephalopathy. The patient was discharged to a skilled nursing facility but then was subsequently taken home by his daughter on July 27, since going home he has had hospice following him and they were brought in because of progressive worsening condition and decline with falls. He also over the past few days has been more confused, somnolent, and difficult to arouse. It is not clear if he has been taking his lactulose. The patient was unresponsive basically for almost 24 hours, so EMS was called after the hospice nurse assessed him and they brought him to the Emergency Room with lethargy and altered mental status.   PAST MEDICAL HISTORY: COPD, chronic respiratory failure on 2-3 liters of oxygen, coronary artery disease, congestive heart failure with systolic dysfunction, hypertension, bradycardia with permanent pacemaker placement, chronic sinusitis, atrial fibrillation diabetes, hyperlipidemia, hypothyroidism, obesity, anxiety, depression, liver cirrhosis with hepatic encephalopathy.   PAST SURGICAL HISTORY: Cholecystectomy, coronary bypass surgery, skin cancer surgery, AV nodal ablation for atrial fibrillation, lumbar laminectomy, permanent pacemaker placement.   ALLERGIES: Patient states there are none.  SOCIAL HISTORY: Smoked 40  pack-years but quit; now lives with his daughter. Retired. Disabled.  FAMILY HISTORY:  Again coronary artery disease.   REVIEW OF SYSTEMS:  Difficult to obtain because the patient is sedated, confused  has been weak and tired. No nausea or vomiting. Denies fever, chills, sweats. No weight loss. No weight gain. No hemoptysis or hematemesis. Denies bright red blood per rectum. No vision change or hearing change. Denies sputum production and cough.  MEDICATIONS: Tramadol 50 mg twice a day, ProAir 2 puffs 4 times a day, omeprazole 20 mg twice a day, nitroglycerin sublingual as needed, metoprolol 50 mg twice a day, lisinopril 10 mg a day, levothyroxine 88 mcg a day, Lasix 80 mg a day, lactulose 30 mL twice a day, gabapentin 300 mg 2 capsules twice a day, fluticasone 2 puffs twice a day, citalopram 40 mg a day, atorvastatin 80 mg a day.   PHYSICAL EXAMINATION:  VITAL SIGNS: Blood pressure was 140/70, pulse 75, respiratory rate 16, afebrile.  HEENT: Normocephalic, atraumatic. Pupils equal and reactive to light.  NECK: Supple. No significant JVD, bruits or adenopathy.  LUNGS: Clear to auscultation and percussion. No significant wheezing, rhonchi, or rales. HEART: Regular rate and rhythm. No significant murmur, gallops, or rubs.  ABDOMEN: Benign.  EXTREMITY: Within normal limits.  NEUROLOGIC: Lethargic, arousable, beginning, to answer questions. Somnolent. No focal deficits.   LABORATORY DATA:  Glucose 98, BUN 36, creatinine 2.39, sodium 137, potassium 3.4, chloride of 98, troponin was 2.10. White count 73, hemoglobin 9, platelet count of 263,000.   CT of the head essentially no acute findings.   EKG had nonspecific ST-T changes, rate of about 75.   ASSESSMENT:  1.  Acute encephalopathy, possibly from cirrhosis, noncompliance with lactulose.  2.  Elevated troponin.  3.  Congestive heart failure.  4.  Cardiomyopathy. 5.  Hyperlipidemia.  6.  Obesity.  7.  Thyroid disease.  8.  Diabetes.  9.   Chronic obstructive pulmonary disease.  10. Possible hypoxemia.   PLAN: I agree with therapy with lactulose to help with possible encephalopathy. Continue oxygen therapy for possible hypoxemia. Continue blood pressure control. Continue diabetes management. Agree with lipid management with Lipitor. Follow up renal function. Continue therapy for heart failure, inhalers for chronic obstructive pulmonary disease, continue omeprazole for gastroesophageal reflux disease. Rate control with metoprolol, heart failure therapy and hypertension therapy with metoprolol and lisinopril, Lasix for heart failure, as well. Will continue to follow the patient medically. Do not recommend cardiac catheterization. Agree with hospice therapy, home health therapy. Consider palliative care as well.    ____________________________ Bobbie Stackwayne D. Juliann Paresallwood, MD ddc:lt D: 11/23/2013 10:20:00 ET T: 11/23/2013 10:53:27 ET JOB#: [id number]  cc: Dwayne D. Juliann Paresallwood, MD, <Dictator> Alwyn PeaWAYNE D CALLWOOD MD ELECTRONICALLY SIGNED 12/15/2013 14:53

## 2014-07-10 NOTE — Discharge Summary (Signed)
PATIENT NAME:  Rodney Eaton, Rodney Eaton MR#:  [id number] DATE OF BIRTH:  January 07, 1941  DATE OF ADMISSION:  11/21/2013 DATE OF DISCHARGE:    CONSULTING PHYSICIANS: Palliative care with Dr. Harvie JuniorPhifer ,  cardiology, Dr. Juliann Paresallwood.   DISCHARGE DIAGNOSES:  1. Altered mental. 2. Hepatic encephalopathy.  3. Cirrhosis.  4. History of congestive heart failure with volume overload.  5. Positive troponins, likely supply/demand ischemia.  6. Chronic obstructive pulmonary disease with chronic respiratory failure.   HISTORY OF PRESENT ILLNESS: Please see initial history and physical for details. Briefly, this is a 74 year old gentleman with a history of cirrhosis, as well as CHF and chronic respiratory failure on home O2. He was admitted with confusion. This was felt likely to be due to hepatic encephalopathy. He also had elevated troponins.   HOSPITAL COURSE BY ISSUE:  1. Hepatic encephalopathy. Ammonia level was initially 44, but came down to 21 with lactulose. He became much more alert and interactive and returned to his baseline. He will be continued on lactulose.  2. Elevated troponin. He was seen by Dr. Juliann Paresallwood. Recommended this is likely supply/demand ischemia and continue medical management. 3. Congestive heart failure. The patient was diuresed with IV Lasix. He will be resumed on his outpatient Lasix dosing.  4. Chronic renal failure. This remained relatively stable with diuresis.  5. Chronic obstructive pulmonary disease with chronic respiratory failure. He remained on his nebulizer treatment and supplemental oxygen. 6. Hypothyroidism. He remained on his Synthroid. His level was slightly elevated, but had recently been adjusted.  7. Hyperlipidemia. He was continued on his statin.  DISCHARGE MEDICATIONS: 1. Citalopram 40 mg once a day. 2. Atorvastatin 40 mg once a day.  3. Gabapentin 300 mg 2 capsules twice a day. 4. Levothyroxine 88 mcg once a day.  5. Tramadol 50 mg 2 tablets twice a day.   6. Lisinopril 10 mg once a day.  7. Nitroglycerin sublingual as needed.  8. Advair 250/50 one puff twice a day.  9. Lactulose 10 grams in 15 mL syrup 30 mL twice a day, titrate as needed for 2 to 3 loose stools a day.  10. Lasix 80 mg once a day.  11. ProAir inhaler 2 puffs 4 times a day as needed.  12. Omeprazole 20 mg twice a day.  13. Ambien 10 mg once a day at bedtime as needed for sleep.  14. Flonase 2 sprays each nostril once a day.  15. Aspirin 81 mg once a day.  16. Bisacodyl 5 mg at bedtime for constipation.  17. Metoprolol 50 mg once a day.   DISCHARGE FOLLOW-UP: The patient will follow up with Dr. Sampson GoonFitzgerald in 1 to 2 weeks.   TIME SPENT: This discharge took 40 minutes.    ____________________________ Stann Mainlandavid P. Sampson GoonFitzgerald, MD dpf:JT D: 11/25/2013 10:09:00 ET T: 11/25/2013 10:42:31 ET JOB#: [id number]  cc: Stann Mainlandavid P. Sampson GoonFitzgerald, MD, <Dictator> Thang Flett Sampson GoonFITZGERALD MD ELECTRONICALLY SIGNED 11/26/2013 10:25

## 2014-07-10 NOTE — Consult Note (Signed)
PATIENT NAME:  Rodney Eaton, Rodney Eaton MR#:  [id number] DATE OF BIRTH:  11/22/1940  DATE OF CONSULTATION:  06/11/2013  REFERRING PHYSICIAN:   CONSULTING PHYSICIAN:  Dwayne D. Juliann Paresallwood, MD  PRIMARY CARE PHYSICIAN: Dr. Clydie Braunavid Fitzgerald   PRIMARY CARDIOLOGIST: Dr. Gwen PoundsKowalski   PULMONOLOGIST: Dr. Ned ClinesHerbon Fleming   INDICATION: Shortness of breath.   HISTORY OF PRESENT ILLNESS: Mr. Janee Mornhompson is a 74 year old white male with past medical history of multiple medical problems, recently in the hospital, direct admit, due to increasing shortness of breath progressively worsening over the last month or so. The patient [facility] and had progressive shortness of breath. Admits to orthopnea, PND, dyspnea on exertion, leg swelling and distal fluid accumulation. The patient was being treated with Lasix twice a day but no significant improvement in his shortness of breath. He progressively needed more oxygen using 2 to 3 liters nasal cannula at home. Saw Dr. Meredeth IdeFleming who then directly admitted him for further evaluation and management. The patient denied much in the way of chest pain. Had some right upper quadrant incisional discomfort.   REVIEW OF SYSTEMS: Denies blackout spells or syncope. Denies nausea or vomiting. No fever, no chills. No sweats. No weight loss. No weight gain, hemoptysis, hematemesis. No bright red blood per rectum. No vision change, hearing change. No sputum production or cough.   PAST MEDICAL HISTORY: Multiple medical problems including chronic respiratory failure on 2 to 3 liters home O2, gallbladder problems, sick sinus syndrome, chronic sinusitis, COPD,  hypothyroidism, obesity, anxiety, depression, coronary artery disease.  PAST SURGICAL HISTORY: Coronary artery bypass surgery, pacemaker placement, gallbladder surgery, CPAP, lumbar laminectomy, cardioversions.   ALLERGIES: None.   MEDICATIONS: Advair Diskus 250/50, Ambien 10 mg a day, aspirin 81 mg a day,  atorvastatin 40 a day, citalopram 40 a day, Lasix 40 twice a day, metoprolol 25, tramadol 50 a day.   SOCIAL HISTORY: Forty pack-year, no longer smokes, divorced, 3 children.   FAMILY HISTORY: Coronary artery disease, myocardial infarction, coronary artery bypass surgery.   PHYSICAL EXAMINATION: VITAL SIGNS: Blood pressure 140/80, pulse 60, respiratory rate 16, afebrile.  HEENT: Normocephalic, atraumatic. Pupils equal and reactive to light.  NECK: Supple. No significant JVD, bruits or adenopathy.  LUNGS: Clear to auscultation and percussion with mild rhonchi. Mild rales in bases. No wheezing.  HEART: Regular rate and rhythm. Systolic ejection murmur at left sternal border.  ABDOMEN: Benign.  EXTREMITIES: Within normal limits.  NEUROLOGIC: Intact.  SKIN: Normal.   DIAGNOSTIC DATA: EKG: Normal sinus rhythm, nonspecific ST-T wave changes.   LABORATORY DATA: Glucose 114, BUN 19, sodium 134, calcium 8.2. LFTs negative. Cardiac enzymes negative. White count 12.1, hemoglobin 9, platelet count 279,000.  PT 15.7.   ASSESSMENT:  1.  Dyspnea. 2.  Edema. 3.  Obstructive sleep apnea.  4.  Diabetes.  5.  Hypertension.  6.  Leukocytosis.  7.  Restless leg syndrome. 8.  Status post gallbladder surgery.   PLAN:  1.  Agree with admit. Rule out for myocardial infarction. Continue diuresis. Consider whether thoracentesis is necessary. Continue oxygen support. Continue pulmonary support. Again, continue diuretic therapy.  2.  Continue blood pressure control as necessary.  3.  For atrial fibrillation and tachycardia, continue rate control.  4.  For hyperlipidemia, continue lipid management.  5.  For chronic obstructive pulmonary disease, continue Advair Diskus.  6.  Recommend weight loss, exercise, have the patient continue aggressive diuresis, continue diabetes control. 7.  Leukocytosis. Follow-up x-ray and urine. 8.  Restless  leg syndrome. Treatment as necessary. We will see how the patient  responds to aggressive diuretic therapy.   ____________________________ Bobbie Stack. Juliann Pares, MD ddc:sb D: 06/12/2013 15:11:54 ET T: 06/12/2013 15:41:52 ET JOB#: [id number]  cc: Dwayne D. Juliann Pares, MD, <Dictator> Alwyn Pea MD ELECTRONICALLY SIGNED 06/30/2013 12:42

## 2014-12-13 IMAGING — CT CT CERVICAL SPINE WITHOUT CONTRAST
3 of 4 series · 13 of 33 positions shown, 16 images · non-contrast
Comparison: None.

CLINICAL DATA: Fall, head laceration, neck pain

EXAM:
CT HEAD WITHOUT CONTRAST
CT CERVICAL SPINE WITHOUT CONTRAST
TECHNIQUE: Multidetector CT imaging of the head and cervical spine was
performed following the standard protocol without intravenous
contrast. Multiplanar CT image reconstructions of the cervical spine
were also generated.

[Series 4: sag bone · sagittal · 0.31mm/px · 5 of 76 slices shown, 6 images]
[im 26/76  bone]
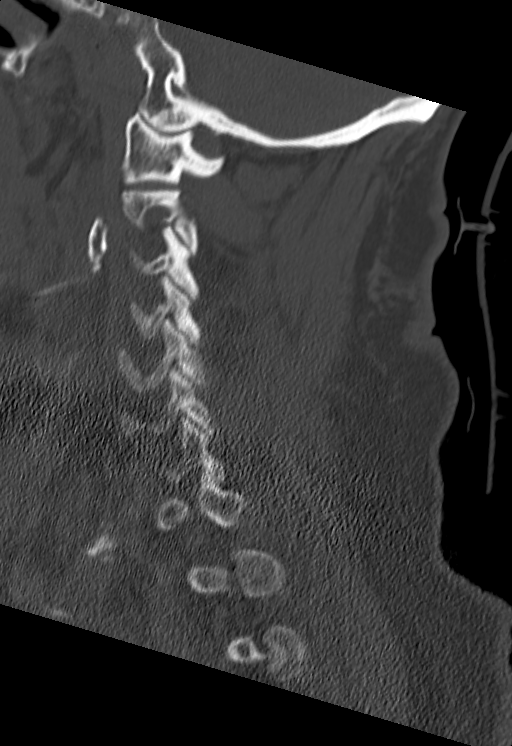
[im 32/76  bone]
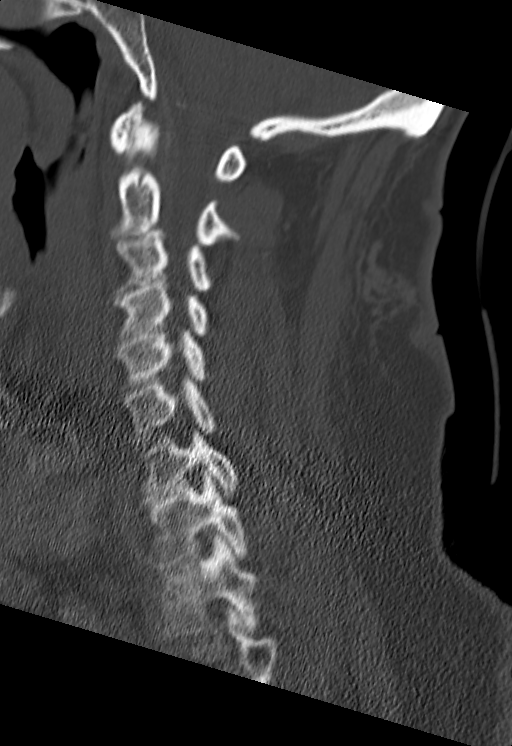
[im 38/76  soft-tissue]
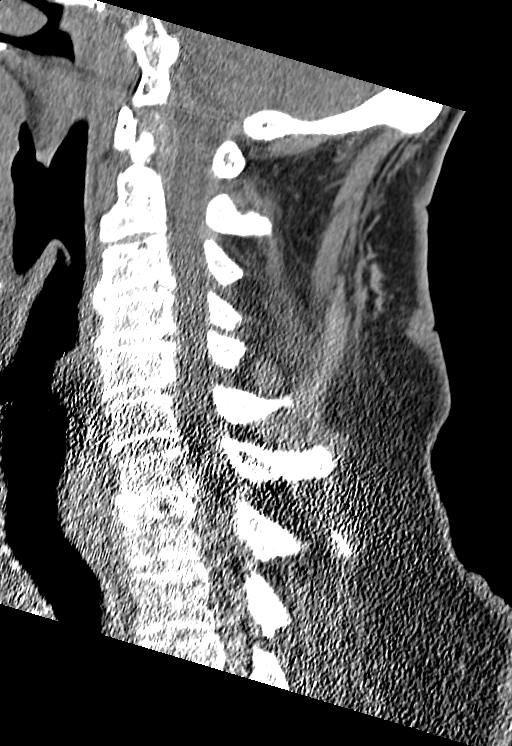
[im 38/76  bone]
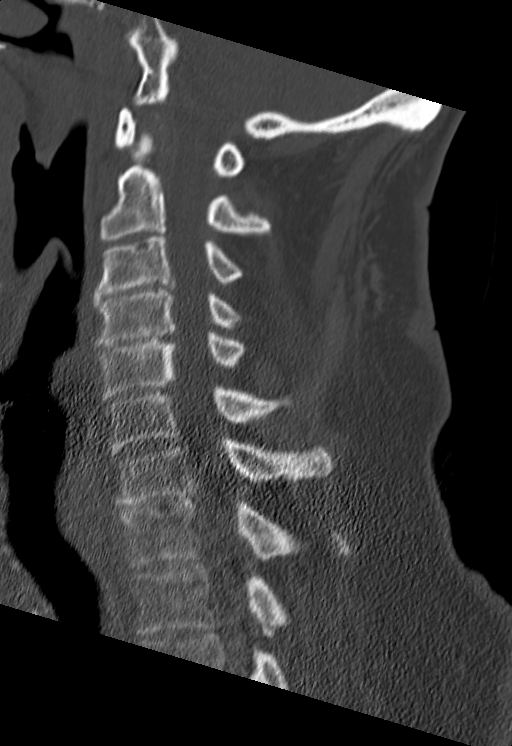
[im 44/76  bone]
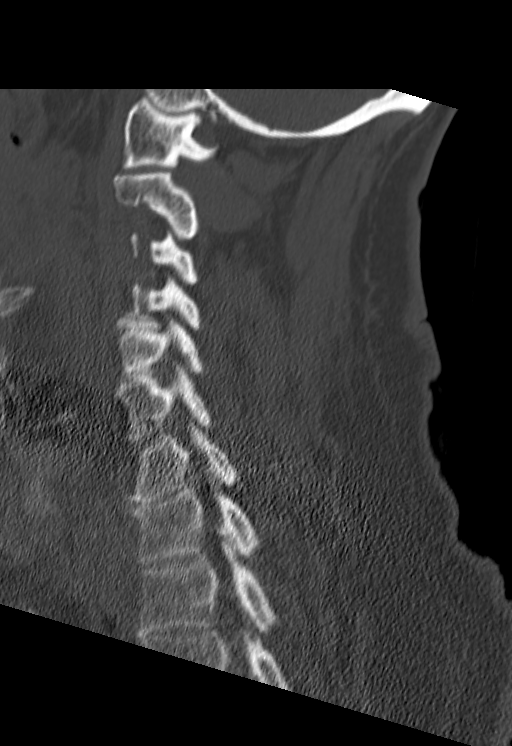
[im 51/76  bone]
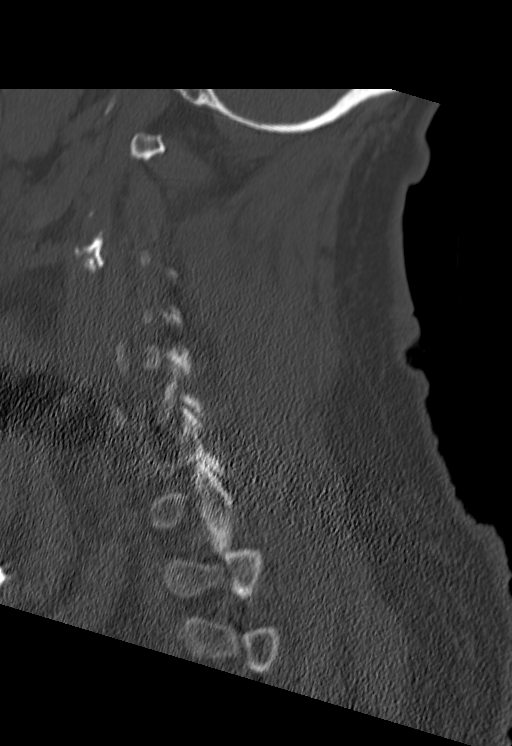

[Series 5: cor bone · coronal · 0.32mm/px · 3 of 79 slices shown]
[im 16/79  bone]
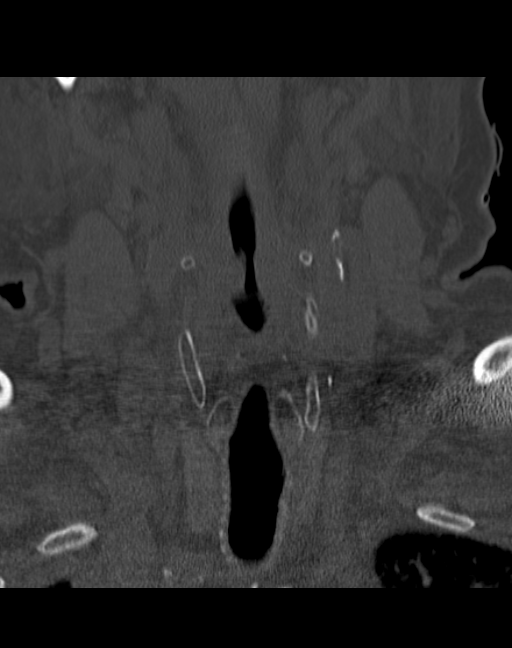
[im 32/79  bone]
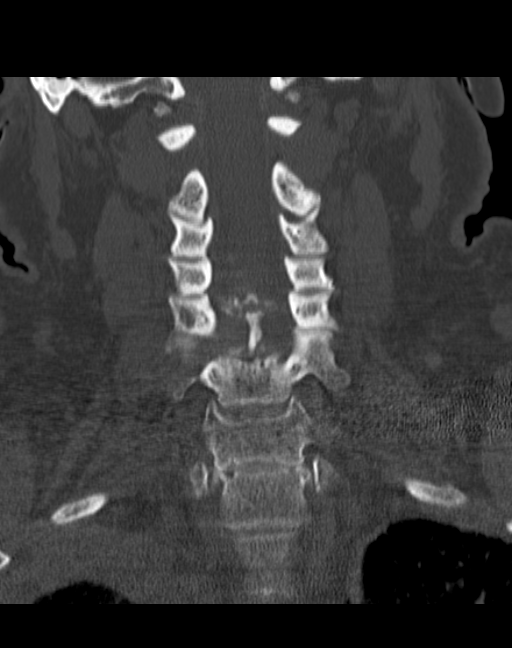
[im 47/79  bone]
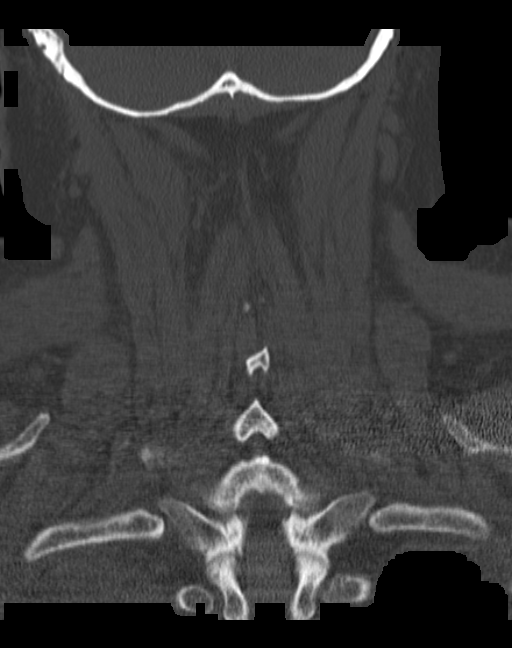

[Series 6: orthogonal axials · axial · 0.29mm/px · z∈[-298,-138]mm · 5 of 119 slices shown, 7 images]
[im 17/119  soft-tissue]
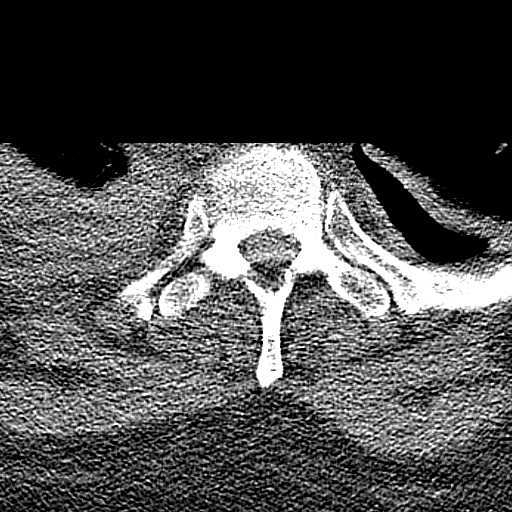
[im 17/119  bone]
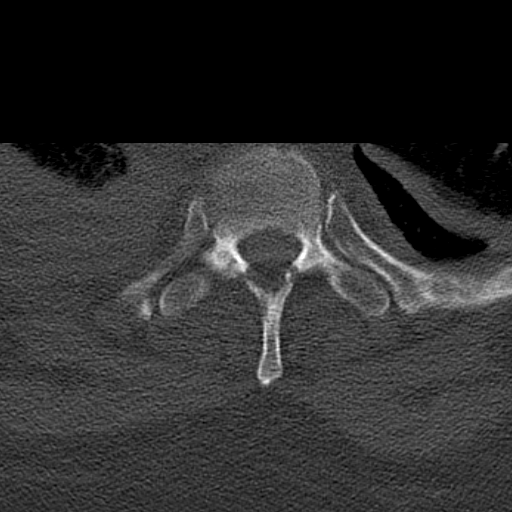
[im 34/119  bone]
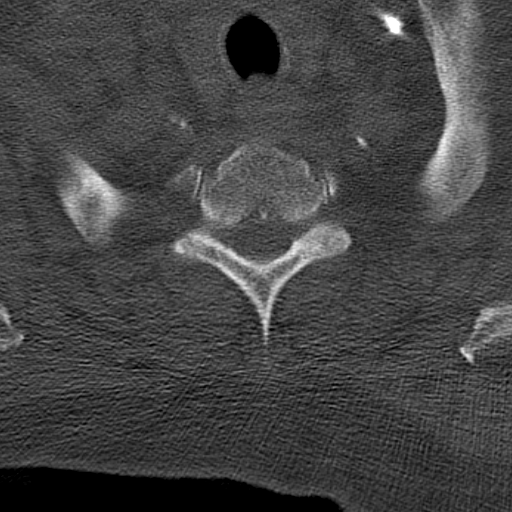
[im 68/119  bone]
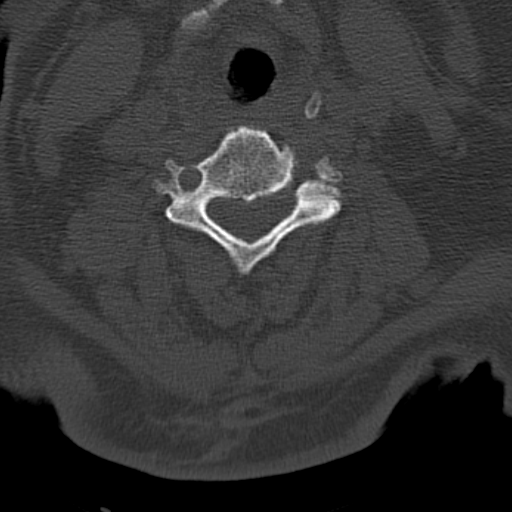
[im 85/119  bone]
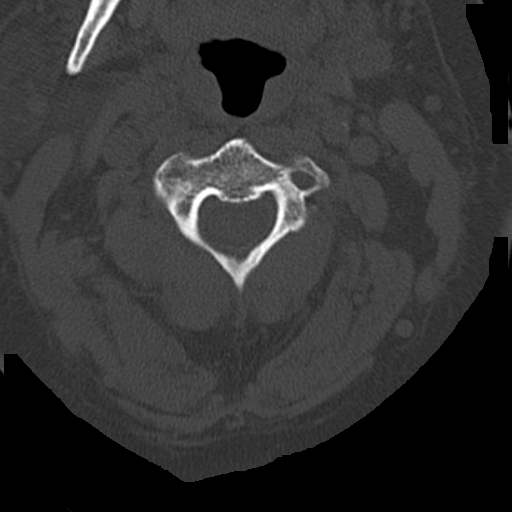
[im 102/119  soft-tissue]
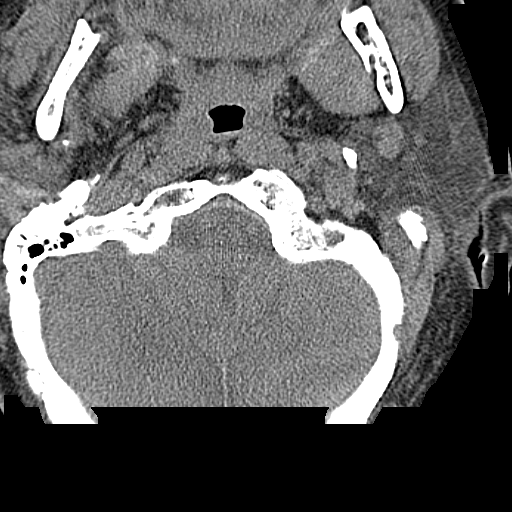
[im 102/119  bone]
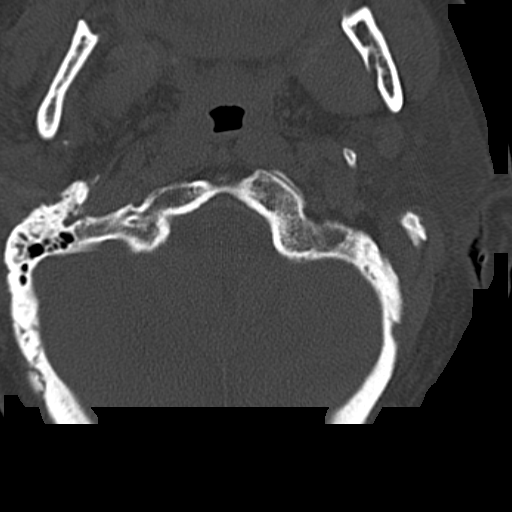

[13 of 33 positions shown; findings below may reference images not displayed]

FINDINGS: CT HEAD FINDINGS

No evidence of parenchymal hemorrhage or extra-axial fluid
collection. No mass lesion, mass effect, or midline shift.

No CT evidence of acute infarction.

Subcortical white matter and periventricular small vessel ischemic
changes. Intracranial atherosclerosis.

Cerebral volume is within normal limits.  No ventriculomegaly.

Mild mucosal thickening of the bilateral ethmoid sinuses. Mastoid
air cells are clear.

Extracranial hematoma/soft tissue laceration overlying the left
vertex.

No evidence of calvarial fracture.

CT CERVICAL SPINE FINDINGS

Straightening of the cervical spine.

No evidence of fracture or dislocation. Vertebral body heights are
maintained. Dens appears intact.

No prevertebral soft tissue swelling.

Mild to moderate multilevel degenerative changes.

Visualized thyroid is unremarkable.

Visualized lung apices are notable for mild pleural fluid/edema.
IMPRESSION: Extracranial hematoma/soft tissue laceration overlying the left
vertex. No evidence of calvarial fracture.

No evidence of acute intracranial abnormality. Small vessel ischemic
changes intracranial atherosclerosis.

No evidence of traumatic injury to the cervical spine. Mild to
moderate degenerative changes.

Mild pleural fluid/edema at the lung apices.
